# Patient Record
Sex: Female | Born: 1965
Health system: Southern US, Community
[De-identification: ages and names within clinical notes are randomized; demographics above are authoritative.]

## PROBLEM LIST (undated history)

## (undated) DIAGNOSIS — I10 Essential (primary) hypertension: Secondary | ICD-10-CM

## (undated) DIAGNOSIS — G35 Multiple sclerosis: Secondary | ICD-10-CM

## (undated) DIAGNOSIS — H539 Unspecified visual disturbance: Secondary | ICD-10-CM

## (undated) HISTORY — DX: Essential (primary) hypertension: I10

## (undated) HISTORY — DX: Unspecified visual disturbance: H53.9

## (undated) HISTORY — DX: Multiple sclerosis: G35

---

## 1992-03-20 HISTORY — PX: TUBAL LIGATION: SHX77

## 2004-01-20 ENCOUNTER — Ambulatory Visit: Payer: Self-pay | Admitting: Oncology

## 2004-03-23 ENCOUNTER — Ambulatory Visit: Payer: Self-pay | Admitting: Oncology

## 2004-05-11 ENCOUNTER — Ambulatory Visit: Payer: Self-pay | Admitting: Oncology

## 2004-09-02 ENCOUNTER — Ambulatory Visit: Payer: Self-pay | Admitting: Oncology

## 2011-04-19 DIAGNOSIS — G35 Multiple sclerosis: Secondary | ICD-10-CM | POA: Diagnosis not present

## 2011-04-26 DIAGNOSIS — G35 Multiple sclerosis: Secondary | ICD-10-CM | POA: Diagnosis not present

## 2011-05-01 DIAGNOSIS — G35 Multiple sclerosis: Secondary | ICD-10-CM | POA: Diagnosis not present

## 2011-05-11 DIAGNOSIS — K219 Gastro-esophageal reflux disease without esophagitis: Secondary | ICD-10-CM | POA: Diagnosis not present

## 2011-05-11 DIAGNOSIS — Z79899 Other long term (current) drug therapy: Secondary | ICD-10-CM | POA: Diagnosis not present

## 2011-05-11 DIAGNOSIS — D568 Other thalassemias: Secondary | ICD-10-CM | POA: Diagnosis not present

## 2011-05-11 DIAGNOSIS — E7800 Pure hypercholesterolemia, unspecified: Secondary | ICD-10-CM | POA: Diagnosis not present

## 2011-05-11 DIAGNOSIS — I10 Essential (primary) hypertension: Secondary | ICD-10-CM | POA: Diagnosis not present

## 2011-08-25 DIAGNOSIS — G35 Multiple sclerosis: Secondary | ICD-10-CM | POA: Diagnosis not present

## 2011-08-28 DIAGNOSIS — G35 Multiple sclerosis: Secondary | ICD-10-CM | POA: Diagnosis not present

## 2011-08-29 DIAGNOSIS — G35 Multiple sclerosis: Secondary | ICD-10-CM | POA: Diagnosis not present

## 2011-08-30 DIAGNOSIS — G35 Multiple sclerosis: Secondary | ICD-10-CM | POA: Diagnosis not present

## 2011-08-31 DIAGNOSIS — G35 Multiple sclerosis: Secondary | ICD-10-CM | POA: Diagnosis not present

## 2011-09-01 DIAGNOSIS — G35 Multiple sclerosis: Secondary | ICD-10-CM | POA: Diagnosis not present

## 2011-09-02 DIAGNOSIS — G35 Multiple sclerosis: Secondary | ICD-10-CM | POA: Diagnosis not present

## 2011-11-16 DIAGNOSIS — I10 Essential (primary) hypertension: Secondary | ICD-10-CM | POA: Diagnosis not present

## 2011-11-16 DIAGNOSIS — D568 Other thalassemias: Secondary | ICD-10-CM | POA: Diagnosis not present

## 2011-11-16 DIAGNOSIS — K219 Gastro-esophageal reflux disease without esophagitis: Secondary | ICD-10-CM | POA: Diagnosis not present

## 2011-11-16 DIAGNOSIS — E7800 Pure hypercholesterolemia, unspecified: Secondary | ICD-10-CM | POA: Diagnosis not present

## 2011-11-24 DIAGNOSIS — Z1231 Encounter for screening mammogram for malignant neoplasm of breast: Secondary | ICD-10-CM | POA: Diagnosis not present

## 2011-12-15 DIAGNOSIS — R05 Cough: Secondary | ICD-10-CM | POA: Diagnosis not present

## 2011-12-15 DIAGNOSIS — K219 Gastro-esophageal reflux disease without esophagitis: Secondary | ICD-10-CM | POA: Diagnosis not present

## 2012-03-14 DIAGNOSIS — E7800 Pure hypercholesterolemia, unspecified: Secondary | ICD-10-CM | POA: Diagnosis not present

## 2012-03-14 DIAGNOSIS — K219 Gastro-esophageal reflux disease without esophagitis: Secondary | ICD-10-CM | POA: Diagnosis not present

## 2012-03-14 DIAGNOSIS — I10 Essential (primary) hypertension: Secondary | ICD-10-CM | POA: Diagnosis not present

## 2012-03-14 DIAGNOSIS — D568 Other thalassemias: Secondary | ICD-10-CM | POA: Diagnosis not present

## 2012-04-30 DIAGNOSIS — G35 Multiple sclerosis: Secondary | ICD-10-CM | POA: Diagnosis not present

## 2012-09-12 DIAGNOSIS — Z6830 Body mass index (BMI) 30.0-30.9, adult: Secondary | ICD-10-CM | POA: Diagnosis not present

## 2012-09-12 DIAGNOSIS — E7800 Pure hypercholesterolemia, unspecified: Secondary | ICD-10-CM | POA: Diagnosis not present

## 2012-09-12 DIAGNOSIS — Z0000 Encounter for general adult medical examination without abnormal findings: Secondary | ICD-10-CM | POA: Diagnosis not present

## 2012-09-12 DIAGNOSIS — D568 Other thalassemias: Secondary | ICD-10-CM | POA: Diagnosis not present

## 2012-09-12 DIAGNOSIS — I10 Essential (primary) hypertension: Secondary | ICD-10-CM | POA: Diagnosis not present

## 2012-09-12 DIAGNOSIS — Z124 Encounter for screening for malignant neoplasm of cervix: Secondary | ICD-10-CM | POA: Diagnosis not present

## 2012-11-26 DIAGNOSIS — Z1231 Encounter for screening mammogram for malignant neoplasm of breast: Secondary | ICD-10-CM | POA: Diagnosis not present

## 2013-03-18 DIAGNOSIS — D568 Other thalassemias: Secondary | ICD-10-CM | POA: Diagnosis not present

## 2013-03-18 DIAGNOSIS — I10 Essential (primary) hypertension: Secondary | ICD-10-CM | POA: Diagnosis not present

## 2013-03-18 DIAGNOSIS — E7800 Pure hypercholesterolemia, unspecified: Secondary | ICD-10-CM | POA: Diagnosis not present

## 2013-04-11 DIAGNOSIS — Z23 Encounter for immunization: Secondary | ICD-10-CM | POA: Diagnosis not present

## 2013-04-22 DIAGNOSIS — G9389 Other specified disorders of brain: Secondary | ICD-10-CM | POA: Diagnosis not present

## 2013-04-22 DIAGNOSIS — M519 Unspecified thoracic, thoracolumbar and lumbosacral intervertebral disc disorder: Secondary | ICD-10-CM | POA: Diagnosis not present

## 2013-04-22 DIAGNOSIS — G35 Multiple sclerosis: Secondary | ICD-10-CM | POA: Diagnosis not present

## 2013-04-22 DIAGNOSIS — M5090 Cervical disc disorder, unspecified, unspecified cervical region: Secondary | ICD-10-CM | POA: Diagnosis not present

## 2013-04-29 DIAGNOSIS — G35 Multiple sclerosis: Secondary | ICD-10-CM | POA: Diagnosis not present

## 2013-09-30 DIAGNOSIS — Z0000 Encounter for general adult medical examination without abnormal findings: Secondary | ICD-10-CM | POA: Diagnosis not present

## 2013-09-30 DIAGNOSIS — E559 Vitamin D deficiency, unspecified: Secondary | ICD-10-CM | POA: Diagnosis not present

## 2013-09-30 DIAGNOSIS — D568 Other thalassemias: Secondary | ICD-10-CM | POA: Diagnosis not present

## 2013-09-30 DIAGNOSIS — E7800 Pure hypercholesterolemia, unspecified: Secondary | ICD-10-CM | POA: Diagnosis not present

## 2013-09-30 DIAGNOSIS — G35 Multiple sclerosis: Secondary | ICD-10-CM | POA: Diagnosis not present

## 2013-09-30 DIAGNOSIS — I10 Essential (primary) hypertension: Secondary | ICD-10-CM | POA: Diagnosis not present

## 2013-09-30 DIAGNOSIS — G609 Hereditary and idiopathic neuropathy, unspecified: Secondary | ICD-10-CM | POA: Diagnosis not present

## 2013-09-30 DIAGNOSIS — Z111 Encounter for screening for respiratory tuberculosis: Secondary | ICD-10-CM | POA: Diagnosis not present

## 2013-11-27 DIAGNOSIS — Z1231 Encounter for screening mammogram for malignant neoplasm of breast: Secondary | ICD-10-CM | POA: Diagnosis not present

## 2013-12-04 DIAGNOSIS — R928 Other abnormal and inconclusive findings on diagnostic imaging of breast: Secondary | ICD-10-CM | POA: Diagnosis not present

## 2014-04-02 DIAGNOSIS — G35 Multiple sclerosis: Secondary | ICD-10-CM | POA: Diagnosis not present

## 2014-04-02 DIAGNOSIS — G9589 Other specified diseases of spinal cord: Secondary | ICD-10-CM | POA: Diagnosis not present

## 2014-04-03 DIAGNOSIS — D569 Thalassemia, unspecified: Secondary | ICD-10-CM | POA: Diagnosis not present

## 2014-04-03 DIAGNOSIS — G35 Multiple sclerosis: Secondary | ICD-10-CM | POA: Diagnosis not present

## 2014-04-03 DIAGNOSIS — E780 Pure hypercholesterolemia: Secondary | ICD-10-CM | POA: Diagnosis not present

## 2014-04-03 DIAGNOSIS — E559 Vitamin D deficiency, unspecified: Secondary | ICD-10-CM | POA: Diagnosis not present

## 2014-04-03 DIAGNOSIS — I10 Essential (primary) hypertension: Secondary | ICD-10-CM | POA: Diagnosis not present

## 2014-05-05 DIAGNOSIS — G35 Multiple sclerosis: Secondary | ICD-10-CM | POA: Diagnosis not present

## 2014-05-21 ENCOUNTER — Encounter: Payer: Self-pay | Admitting: Neurology

## 2014-05-21 ENCOUNTER — Ambulatory Visit (INDEPENDENT_AMBULATORY_CARE_PROVIDER_SITE_OTHER): Payer: Medicare Other | Admitting: Neurology

## 2014-05-21 VITALS — BP 123/70 | HR 70 | Resp 16 | Ht 70.0 in | Wt 221.0 lb

## 2014-05-21 DIAGNOSIS — G373 Acute transverse myelitis in demyelinating disease of central nervous system: Secondary | ICD-10-CM

## 2014-05-21 DIAGNOSIS — Z79899 Other long term (current) drug therapy: Secondary | ICD-10-CM | POA: Insufficient documentation

## 2014-05-21 DIAGNOSIS — G0489 Other myelitis: Secondary | ICD-10-CM | POA: Diagnosis not present

## 2014-05-21 DIAGNOSIS — G35 Multiple sclerosis: Secondary | ICD-10-CM | POA: Diagnosis not present

## 2014-05-21 NOTE — Progress Notes (Addendum)
[facility]  PATIENT: Margaret Henderson DOB: 05-24-1965  REFERRING DOCTOR OR PCP:  Dr. Cyndi Bender [facility]), Neuro:  Antonieta Loveless SOURCE: paitent and records form [facility]  _________________________________   HISTORICAL  CHIEF COMPLAINT:  Chief Complaint  Patient presents with  . Multiple Sclerosis    Sts. dx. in 2002.  Presenting sx. were numbness from the waist down and decreased grip bilat.  Sts. she was put on Betaseron, which she remains on.  Sts. Dr. Metta Clines told her she had a new, "inactive" lesion "on my spine", on mri done in Feb. 2016 at [place]. she doesn't have any sx. at all currently./fim    HISTORY OF PRESENT ILLNESS:  I had the pleasure of seeing your patient, Margaret Henderson, at [facility] for a neurologic consultation regarding her multiple sclerosis. As you know, she is a 49 year old woman who was diagnosed with multiple sclerosis in 2002 after presenting with numbness below her waist and clumsiness in the arms and legs. At that time, she was found to have an MRI consistent with multiple sclerosis. She also underwent a lumbar puncture. That was also consistent with multiple sclerosis. She was started on Betaseron and has continued on Betaseron. Last year, an MRI of the brain showed 2 new foci not present in her earlier MRI. About 6 weeks ago she had another series of MRI. The brain and cervical spine were unchanged but her thoracic spine showed a new focus. Of note, she has not noted any difference in symptoms.  She is doing very well with her MS and does not note any major symptoms.  Gait/strength/sensation: She does not note any difficulty with her gait. She has no difficulty with balance. She has no difficulty climbing stairs. She denies any numbness or weakness in the arms or legs. There is no problems with coordination. She did note a little bit of allodynia in her feet when she would put her  shoes on. However, that has resolved. This went on for a couple months but resolved last month.  Bladder: She denies any difficulty with bladder or bowel function.  Vision: She denies any history of optic neuritis or diplopia. She has no current visual problems.  Fatigue/sleep: She denies any difficulty with depression or anxiety. She has not noted any significant cognitive dysfunction. She notes no major problems with memory or processing speed or word finding.  REVIEW OF SYSTEMS: Constitutional: No fevers, chills, sweats, or change in appetite Eyes: No visual changes, double vision, eye pain Ear, nose and throat: No hearing loss, ear pain, nasal congestion, sore throat Cardiovascular: No chest pain, palpitations Respiratory: No shortness of breath at rest or with exertion.   No wheezes GastrointestinaI: No nausea, vomiting, diarrhea, abdominal pain, fecal incontinence Genitourinary: No dysuria, urinary retention or frequency.  No nocturia. Musculoskeletal: No neck pain, back pain Integumentary: No rash, pruritus, skin lesions Neurological: as above Psychiatric: No depression at this time.  No anxiety Endocrine: No palpitations, diaphoresis, change in appetite, change in weigh or increased thirst Hematologic/Lymphatic: No anemia, purpura, petechiae. Allergic/Immunologic: No itchy/runny eyes, nasal congestion, recent allergic reactions, rashes  ALLERGIES: No Known Allergies  HOME MEDICATIONS:  Current outpatient prescriptions:  .  amLODipine (NORVASC) 5 MG tablet, Take 5 mg by mouth daily., Disp: , Rfl: 4 .  atorvastatin (LIPITOR) 40 MG tablet, Take 40 mg by mouth daily., Disp: , Rfl: 5 .  BETASERON 0.3 MG KIT injection, , Disp: , Rfl:   PAST MEDICAL HISTORY:  Past Medical History  Diagnosis Date  . Hypertension   . Multiple sclerosis   . Vision abnormalities     PAST SURGICAL HISTORY: Past Surgical History  Procedure Laterality Date  . Tubal ligation  1994     FAMILY HISTORY: Family History  Problem Relation Age of Onset  . Anemia Mother   . Diabetes Mother   . Hypertension Mother     SOCIAL HISTORY:  History   Social History  . Marital Status: Single    Spouse Name: N/A  . Number of Children: 2  . Years of Education: N/A   Occupational History  . Medical records    Social History Main Topics  . Smoking status: Never Smoker   . Smokeless tobacco: Not on file  . Alcohol Use: No  . Drug Use: No  . Sexual Activity: Not on file   Other Topics Concern  . Not on file   Social History Narrative     PHYSICAL EXAM  Filed Vitals:   05/21/14 1411  BP: 123/70  Pulse: 70  Resp: 16  Height: $Remove'5\' 10"'VahPXyz$  (1.778 m)  Weight: 221 lb (100.245 kg)    Body mass index is 31.71 kg/(m^2).   General: The patient is well-developed and well-nourished and in no acute distress  Eyes:  Funduscopic exam shows normal optic discs and retinal vessels.  Neck: The neck is supple, no carotid bruits are noted.  The neck is nontender.  Cardiovascular: The heart has a regular rate and rhythm with a normal S1 and S2. There were no murmurs, gallops or rubs. Lungs are clear to auscultation.  Skin: Extremities are without significant edema.  Musculoskeletal:  Back is nontender  Neurologic Exam  Mental status: The patient is alert and oriented x 3 at the time of the examination. The patient has apparent normal recent and remote memory, with an apparently normal attention span and concentration ability.   Speech is normal.  Cranial nerves: Extraocular movements are full. Pupils are equal, round, and reactive to light and accomodation.  Visual fields are full.  Facial symmetry is present. There is good facial sensation to soft touch bilaterally.Facial strength is normal.  Trapezius and sternocleidomastoid strength is normal. No dysarthria is noted.  The tongue is midline, and the patient has symmetric elevation of the soft palate. No obvious hearing  deficits are noted.  Motor:  Muscle bulk is normal.   Tone is normal. Strength is  5 / 5 in all 4 extremities.   Sensory: Sensory testing is intact to pinprick, soft touch and vibration sensation in all 4 extremities.  Coordination: Cerebellar testing reveals good finger-nose-finger and heel-to-shin bilaterally.  Gait and station: Station is normal.   Gait is normal. Tandem gait is normal. Romberg is negative.   Reflexes: Deep tendon reflexes are symmetric and normal bilaterally.   Plantar responses are flexor.    DIAGNOSTIC DATA (LABS, IMAGING, TESTING) - I reviewed patient records, labs, notes, testing and imaging myself where available.  I personally reviewed MRIs of the brain, cervical spine and thoracic spine from 04/02/2014. In the brain, there are at least one doesn't hyperintense foci, predominantly in the periventricular white matter, radially oriented to the ventricle. Soma also in the juxtacortical white matter. In the cervical spine, there is a focus adjacent to C3 posteriorly and in the thoracic spine, there is a focus adjacent to T8-T9, posteriorly. The thoracic spine was not present on previous MRI films, the brain lesions and the cervical spine lesions were present  on her prior MRI.     ASSESSMENT AND PLAN  Multiple sclerosis - Plan: CBC with Differential/Platelet  Transverse myelitis  High risk medication use   In summary, Margaret Henderson is a 49 year old woman with multiple sclerosis for many years who has been clinically stable on Betaseron for many years but shows progression on MRI with 2 new brain lesions in 2015 and a new thoracic spine lesion last month. It is possible that the minimal dysesthetic sensation she had in her feet over the past several months were related to the thoracic spine focus. Regardless, she is now back to her excellent baseline. I had a detailed discussion about her multiple sclerosis concern about breakthrough disease noted on MRI.    Breakthrough lesions on MRI imaging while on interferon therapy have been shown in a couple of studies to be related to more disability later in life. Therefore, I feel we do need to switch to a possibly more efficacious medicine. I went over the oral medication options and Tysabri.  She has a good acquaintance who is on Tecfidera and she expressed the most interested in that medication. Therefore, I will go ahead and signed her up for Tecfidera we discussed the importance of taking each pill after breakfast and after dinner to minimize flushing and GI related side effects. She understands that there has been ASIS of PML on Tecfidera but that the risk is generally fairly low. She was less interested in Tysabri due to the PML risk.  She will return to see me in a couple months so that we can ensure that she is doing well on the Tecfidera and I will recheck count at that visit that she is not experiencing lymphopenia. After that I would be more than happy to see her or she could follow up with you, depending on your and her preference. She is advised to call us sooner if she has any new or worsening neurologic symptoms.  Thank you for asking me to see Mrs. Harris. Please do not hesitate to let me know if I can be of any further assistance with her or other patients in the future.     Alyscia Carmon A. Felecia Shelling, MD, PhD 10/19/6413, 8:30 PM Certified in Neurology, Clinical Neurophysiology, Sleep Medicine, Pain Medicine and Neuroimaging  [facility] [address] [place], [place] [address] [phone]

## 2014-05-22 ENCOUNTER — Telehealth: Payer: Self-pay | Admitting: *Deleted

## 2014-05-22 LAB — CBC WITH DIFFERENTIAL/PLATELET
BASOS: 0 %
Basophils Absolute: 0 10*3/uL (ref 0.0–0.2)
EOS: 1 %
Eosinophils Absolute: 0.1 10*3/uL (ref 0.0–0.4)
HCT: 36.8 % (ref 34.0–46.6)
HEMOGLOBIN: 12.1 g/dL (ref 11.1–15.9)
Immature Grans (Abs): 0 10*3/uL (ref 0.0–0.1)
Immature Granulocytes: 0 %
LYMPHS ABS: 3.3 10*3/uL — AB (ref 0.7–3.1)
Lymphs: 42 %
MCH: 24.1 pg — ABNORMAL LOW (ref 26.6–33.0)
MCHC: 32.9 g/dL (ref 31.5–35.7)
MCV: 73 fL — AB (ref 79–97)
MONOCYTES: 9 %
MONOS ABS: 0.8 10*3/uL (ref 0.1–0.9)
NEUTROS ABS: 3.7 10*3/uL (ref 1.4–7.0)
Neutrophils Relative %: 48 %
Platelets: 362 10*3/uL (ref 150–379)
RBC: 5.02 x10E6/uL (ref 3.77–5.28)
RDW: 16.2 % — ABNORMAL HIGH (ref 12.3–15.4)
WBC: 7.9 10*3/uL (ref 3.4–10.8)

## 2014-05-22 NOTE — Telephone Encounter (Signed)
-----   Message from Richard A. Sater, MD sent at 05/22/2014 12:51 PM EST ----- Labs ok can send in Tecfidera 

## 2014-05-22 NOTE — Telephone Encounter (Signed)
No phone # listed, will need to send unable to contact letter/fim

## 2014-05-25 ENCOUNTER — Encounter: Payer: Self-pay | Admitting: *Deleted

## 2014-05-25 NOTE — Telephone Encounter (Signed)
No phone # to contact pt. at.  Tecfidera start form faxed to [employer] at fax # [phone]/fim

## 2014-05-25 NOTE — Telephone Encounter (Signed)
-----   Message from Richard A. Epimenio Foot, MD sent at 05/22/2014 12:51 PM EST ----- Labs ok can send in Tecfidera

## 2014-05-29 ENCOUNTER — Telehealth: Payer: Self-pay | Admitting: Neurology

## 2014-05-29 NOTE — Telephone Encounter (Signed)
[facility] is calling             We rcd Tecfidera

## 2014-06-08 ENCOUNTER — Encounter: Payer: Self-pay | Admitting: *Deleted

## 2014-06-10 NOTE — Telephone Encounter (Signed)
The original message was never forwarded to anyone.  I just received message today.  Ins has been contacted and provided with clinical info.  Request is under review.  Starter Kit (Ref Key: [id number]) Maint Dose (Ref Key: [id number])

## 2014-06-10 NOTE — Telephone Encounter (Signed)
Margaret Henderson with [employer] @ [phone] ext [phone], stated PA is needed for Tecifedra.  Also stated patient has MCD, but never received card..Please call and advise.

## 2014-06-15 ENCOUNTER — Telehealth: Payer: Self-pay | Admitting: Neurology

## 2014-06-15 NOTE — Telephone Encounter (Signed)
Pt is calling stating she is waiting to get prior authorization for Tecfiedera, she states it's been a month.  Please call and advise.

## 2014-06-15 NOTE — Telephone Encounter (Signed)
The message regarding PA was sent last week.  We contacted ins, and have not yet gotten a response.  I called Medicaid to follow up.  Spoke with Rosanne Ashing.  He said the patient has a Medicare Drug Plan (which we do not have on file), so they are unable to review request.  Says the ins has to bill Medicare instead of Medicaid.  I contacted Medicare.  Patient has Medical sales representative Rx [employer].  Patient ID # [id number].  Request has been approved valid until 06/15/2015 Ref # [id number].  I called the pharmacy.  Spoke with Sharmaine who transferred me to [facility].  They have noted account and are processing the request.  I called the patient back.  Got no answer.  Left message.

## 2014-06-15 NOTE — Telephone Encounter (Signed)
Vicky with [facility] @ [phone] x [phone], checking status of PA faxed on 3/25 for Rx TECFIDERA.  Please call and advise.

## 2014-08-27 ENCOUNTER — Ambulatory Visit (INDEPENDENT_AMBULATORY_CARE_PROVIDER_SITE_OTHER): Payer: Medicare Other | Admitting: Neurology

## 2014-08-27 ENCOUNTER — Encounter: Payer: Self-pay | Admitting: Neurology

## 2014-08-27 VITALS — BP 134/84 | HR 70 | Resp 16 | Ht 70.0 in | Wt 222.4 lb

## 2014-08-27 DIAGNOSIS — G35 Multiple sclerosis: Secondary | ICD-10-CM | POA: Diagnosis not present

## 2014-08-27 DIAGNOSIS — Z79899 Other long term (current) drug therapy: Secondary | ICD-10-CM

## 2014-08-27 DIAGNOSIS — G0489 Other myelitis: Secondary | ICD-10-CM

## 2014-08-27 DIAGNOSIS — G373 Acute transverse myelitis in demyelinating disease of central nervous system: Secondary | ICD-10-CM

## 2014-08-27 NOTE — Progress Notes (Signed)
[facility]  PATIENT: Margaret Henderson DOB: [birth year]  REFERRING DOCTOR OR PCP:  Dr. Lonie Peak [facility]), Neuro:  Lincoln Brigham SOURCE: paitent and records form [facility]  _________________________________   HISTORICAL  CHIEF COMPLAINT:  Chief Complaint  Patient presents with  . Multiple Sclerosis    Sts. she tolerates Tecfidera well.  Flushing has completely resolved.  She denies new or worsening sx/fim    HISTORY OF PRESENT ILLNESS:  Margaret Henderson is a 49 yo woman with RRMS. She was diagnosed with multiple sclerosis in 2002 after presenting with numbness below her waist and clumsiness in the arms and legs. At that time, she was found to have an MRI consistent with multiple sclerosis. She also underwent a lumbar puncture and CSF was  consistent with multiple sclerosis. She was started on Betaseron and has continued on Betaseron. Last year, an MRI of the brain showed 2 new foci not present in her earlier MRI. About 6 weeks ago she had another series of MRI. The brain and cervical spine were unchanged but her thoracic spine showed a new focus. Of note, she has not noted any difference in symptoms.   [facility]  She started Tecfidera several months ago.  She never had any GI symptoms. She did note some flushing but that has greatly improved after the first month.  Gait/strength/sensation: Gait is doing well and she has no difficulty with balance. She has no difficulty climbing stairs. She denies any numbness or weakness in the arms or legs. There is no problems with coordination.   Last year, she did have an episode of mild dysesthesias in her feet that resolved after about 2 weeks.  Bladder: She denies any difficulty with bladder or bowel function.  Vision: She denies any history of optic neuritis or diplopia. She has no current visual problems.  Mood/cognition/Fatigue/sleep: She denies any difficulty with depression or anxiety. She has not  noted any significant cognitive dysfunction. She notes no major problems with memory or processing speed or word finding.   She generally does not note any significant fatigue. If temperatures are very hot she will feel a little more tired. She feels that she sleeps well at night.  REVIEW OF SYSTEMS: Constitutional: No fevers, chills, sweats, or change in appetite Eyes: No visual changes, double vision, eye pain Ear, nose and throat: No hearing loss, ear pain, nasal congestion, sore throat Cardiovascular: No chest pain, palpitations Respiratory: No shortness of breath at rest or with exertion.   No wheezes GastrointestinaI: No nausea, vomiting, diarrhea, abdominal pain, fecal incontinence Genitourinary: No dysuria, urinary retention or frequency.  No nocturia. Musculoskeletal: No neck pain, back pain Integumentary: No rash, pruritus, skin lesions Neurological: as above Psychiatric: No depression at this time.  No anxiety Endocrine: No palpitations, diaphoresis, change in appetite, change in weigh or increased thirst Hematologic/Lymphatic: No anemia, purpura, petechiae. Allergic/Immunologic: No itchy/runny eyes, nasal congestion, recent allergic reactions, rashes  ALLERGIES: No Known Allergies  HOME MEDICATIONS:  Current outpatient prescriptions:  .  amLODipine (NORVASC) 5 MG tablet, Take 5 mg by mouth daily., Disp: , Rfl: 4 .  atorvastatin (LIPITOR) 40 MG tablet, Take 40 mg by mouth daily., Disp: , Rfl: 5 .  cholecalciferol (VITAMIN D) 1000 UNITS tablet, Take 2,000 Units by mouth 2 (two) times daily., Disp: , Rfl:  .  Dimethyl Fumarate 120 & 240 MG MISC, Take by mouth., Disp: , Rfl:   PAST MEDICAL HISTORY: Past Medical History  Diagnosis Date  .  Hypertension   . Multiple sclerosis   . Vision abnormalities     PAST SURGICAL HISTORY: Past Surgical History  Procedure Laterality Date  . Tubal ligation  1994    FAMILY HISTORY: Family History  Problem Relation Age of Onset    . Anemia Mother   . Diabetes Mother   . Hypertension Mother     SOCIAL HISTORY:  History   Social History  . Marital Status: Single    Spouse Name: N/A  . Number of Children: 2  . Years of Education: N/A   Occupational History  . Medical records    Social History Main Topics  . Smoking status: Never Smoker   . Smokeless tobacco: Not on file  . Alcohol Use: No  . Drug Use: No  . Sexual Activity: Not on file   Other Topics Concern  . Not on file   Social History Narrative     PHYSICAL EXAM  Filed Vitals:   08/27/14 1147  BP: 134/84  Pulse: 70  Resp: 16  Height:  (1.778 m)  Weight: 222 lb 6.4 oz (100.88 kg)    Body mass index is 31.91 kg/(m^2).   General: The patient is well-developed and well-nourished and in no acute distress   Neurologic Exam  Mental status: The patient is alert and oriented x 3 at the time of the examination. The patient has apparent normal recent and remote memory, with an apparently normal attention span and concentration ability.   Speech is normal.  Cranial nerves: Extraocular movements are full. Facial symmetry is present. There is good facial sensation to soft touch bilaterally.Facial strength is normal.  Trapezius and sternocleidomastoid strength is normal. No dysarthria is noted.  The tongue is midline, and the patient has symmetric elevation of the soft palate. No obvious hearing deficits are noted.  Motor:  Muscle bulk is normal.   Tone is normal. Strength is  5 / 5 in all 4 extremities.   Sensory: Sensory testing is intact to pinprick, soft touch and vibration sensation in all 4 extremities.  Coordination: Cerebellar testing reveals good finger-nose-finger and heel-to-shin bilaterally.  Gait and station: Station is normal.   Gait is normal. Tandem gait is mildly wide and Romberg is borderline.   Reflexes: Deep tendon reflexes are symmetric and normal bilaterally.      DIAGNOSTIC DATA (LABS, IMAGING, TESTING) - I  reviewed patient records, labs, notes, testing and imaging myself where available.  I personally reviewed MRIs of the brain, cervical spine and thoracic spine from 04/02/2014. In the brain, there are at least one doesn't hyperintense foci, predominantly in the periventricular white matter, radially oriented to the ventricle. Soma also in the juxtacortical white matter. In the cervical spine, there is a focus adjacent to C3 posteriorly and in the thoracic spine, there is a focus adjacent to T8-T9, posteriorly. The thoracic spine was not present on previous MRI films, the brain lesions and the cervical spine lesions were present on her prior MRI.     ASSESSMENT AND PLAN  Multiple sclerosis  Transverse myelitis  High risk medication use  1.  She will continue Tecfidera. I will check her CBC and differential to make sure that she does not have lymphopenia. We discussed that is a common side effect but if the levels go down too low, we would need to consider switching her off the medication. 2.  Continue vitamin D supplements at 4000 units daily. 3.  She will remain active and exercises tolerated. 4.  Return to clinic 6 months or sooner if there are new or worsening neurologic symptoms.   Richard A. Epimenio Foot, MD, PhD 08/27/2014, 12:12 PM Certified in Neurology, Clinical Neurophysiology, Sleep Medicine, Pain Medicine and Neuroimaging  [facility] [address] [place], [place] [address] [phone]

## 2014-08-28 ENCOUNTER — Encounter: Payer: Self-pay | Admitting: *Deleted

## 2014-08-28 ENCOUNTER — Telehealth: Payer: Self-pay | Admitting: *Deleted

## 2014-08-28 LAB — CBC WITH DIFFERENTIAL/PLATELET
BASOS ABS: 0 10*3/uL (ref 0.0–0.2)
BASOS: 0 %
EOS (ABSOLUTE): 0.2 10*3/uL (ref 0.0–0.4)
EOS: 4 %
Hematocrit: 36 % (ref 34.0–46.6)
Hemoglobin: 11.8 g/dL (ref 11.1–15.9)
Immature Grans (Abs): 0 10*3/uL (ref 0.0–0.1)
Immature Granulocytes: 0 %
LYMPHS ABS: 1.1 10*3/uL (ref 0.7–3.1)
LYMPHS: 28 %
MCH: 23.8 pg — AB (ref 26.6–33.0)
MCHC: 32.8 g/dL (ref 31.5–35.7)
MCV: 73 fL — ABNORMAL LOW (ref 79–97)
MONOCYTES: 15 %
Monocytes Absolute: 0.6 10*3/uL (ref 0.1–0.9)
NEUTROS ABS: 2.2 10*3/uL (ref 1.4–7.0)
Neutrophils: 53 %
PLATELETS: 314 10*3/uL (ref 150–379)
RBC: 4.95 x10E6/uL (ref 3.77–5.28)
RDW: 16.5 % — AB (ref 12.3–15.4)
WBC: 4.1 10*3/uL (ref 3.4–10.8)

## 2014-08-28 NOTE — Telephone Encounter (Signed)
Letter informing Margaret Henderson of lab results as below was mailed to her today. (she has no phone #).  See letter for details/fim

## 2014-08-28 NOTE — Telephone Encounter (Signed)
-----   Message from Asa Lente, MD sent at 08/28/2014  8:27 AM EDT ----- Plead let her know the lymphocytes look ok.   The RBC's are a little small, she should take OTC iron supplement

## 2014-09-08 ENCOUNTER — Telehealth: Payer: Self-pay | Admitting: Neurology

## 2014-09-08 DIAGNOSIS — D569 Thalassemia, unspecified: Secondary | ICD-10-CM | POA: Insufficient documentation

## 2014-09-08 NOTE — Telephone Encounter (Signed)
I have spoken with Margaret Henderson this morning who sts. she has a known hx. of thalassemia--has seen a  hematologist in the past and is already taking a daily Fe supplement (see recent labwork, result note for more details.  I have added thalassemia to her problem list/fim

## 2014-09-08 NOTE — Telephone Encounter (Signed)
Patient called and requested to speak with Faith RN regarding a letter she sent to the patient. Please call and advise.

## 2014-10-08 DIAGNOSIS — Z1239 Encounter for other screening for malignant neoplasm of breast: Secondary | ICD-10-CM | POA: Diagnosis not present

## 2014-10-08 DIAGNOSIS — E780 Pure hypercholesterolemia: Secondary | ICD-10-CM | POA: Diagnosis not present

## 2014-10-08 DIAGNOSIS — I10 Essential (primary) hypertension: Secondary | ICD-10-CM | POA: Diagnosis not present

## 2014-10-08 DIAGNOSIS — Z0000 Encounter for general adult medical examination without abnormal findings: Secondary | ICD-10-CM | POA: Diagnosis not present

## 2014-10-08 DIAGNOSIS — D569 Thalassemia, unspecified: Secondary | ICD-10-CM | POA: Diagnosis not present

## 2014-10-08 DIAGNOSIS — G35 Multiple sclerosis: Secondary | ICD-10-CM | POA: Diagnosis not present

## 2014-10-08 DIAGNOSIS — Z6832 Body mass index (BMI) 32.0-32.9, adult: Secondary | ICD-10-CM | POA: Diagnosis not present

## 2014-10-08 DIAGNOSIS — E559 Vitamin D deficiency, unspecified: Secondary | ICD-10-CM | POA: Diagnosis not present

## 2014-10-08 DIAGNOSIS — Z1389 Encounter for screening for other disorder: Secondary | ICD-10-CM | POA: Diagnosis not present

## 2014-12-17 DIAGNOSIS — Z1231 Encounter for screening mammogram for malignant neoplasm of breast: Secondary | ICD-10-CM | POA: Diagnosis not present

## 2014-12-17 DIAGNOSIS — R928 Other abnormal and inconclusive findings on diagnostic imaging of breast: Secondary | ICD-10-CM | POA: Diagnosis not present

## 2014-12-31 DIAGNOSIS — N6081 Other benign mammary dysplasias of right breast: Secondary | ICD-10-CM | POA: Diagnosis not present

## 2014-12-31 DIAGNOSIS — R928 Other abnormal and inconclusive findings on diagnostic imaging of breast: Secondary | ICD-10-CM | POA: Diagnosis not present

## 2014-12-31 DIAGNOSIS — N6489 Other specified disorders of breast: Secondary | ICD-10-CM | POA: Diagnosis not present

## 2015-02-25 ENCOUNTER — Ambulatory Visit (INDEPENDENT_AMBULATORY_CARE_PROVIDER_SITE_OTHER): Payer: Medicare Other | Admitting: Neurology

## 2015-02-25 ENCOUNTER — Encounter: Payer: Self-pay | Admitting: Neurology

## 2015-02-25 VITALS — BP 126/74 | HR 68 | Resp 14 | Ht 70.0 in | Wt 220.4 lb

## 2015-02-25 DIAGNOSIS — G35 Multiple sclerosis: Secondary | ICD-10-CM

## 2015-02-25 DIAGNOSIS — G373 Acute transverse myelitis in demyelinating disease of central nervous system: Secondary | ICD-10-CM | POA: Diagnosis not present

## 2015-02-25 DIAGNOSIS — Z79899 Other long term (current) drug therapy: Secondary | ICD-10-CM | POA: Diagnosis not present

## 2015-02-25 NOTE — Progress Notes (Signed)
[facility]  PATIENT: Margaret Henderson DOB: June 01, 1965  REFERRING DOCTOR OR PCP:  Dr. Lonie Peak [facility] Neuro:  Lincoln Brigham SOURCE: paitent and [facility] form [facility]  _________________________________   HISTORICAL  CHIEF COMPLAINT:  Chief Complaint  Patient presents with  . Multiple Sclerosis    Sts. she continues to tolerate Tecfidera well.  Deneis new or worsening sx./fim    HISTORY OF PRESENT ILLNESS:  Margaret Henderson is a 49 yo woman with RRMS.   She started Tecfidera earlier this year and is tolerating it well.    She never had any GI symptoms. At first she had flushing but that is doing better.    She denies any new exacerbation.   She takes Vit D supplements daily.    Gait/strength/sensation: Gait is doing well and she has no difficulty with balance. She has no difficulty climbing stairs. She denies any numbness or weakness in the arms or legs. There is no problems with coordination.     Bladder: She denies any difficulty with bladder or bowel function.   She denies nocturia.   No constipation.    Vision: She denies any history of optic neuritis or diplopia. She has no current visual problems.  Mood/cognition:  She denies any difficulty with depression or anxiety. She has not noted any significant cognitive dysfunction. She notes no major problems with memory or processing speed or word finding.    Fatigue/sleep:   She has occasional days with fatigue, usually later in the day at work if she has longer hours.   A little rest helps her feel better.  She usually does not have heat intolerance.   She feels that she sleeps well at night.  MS History:  She was diagnosed with multiple sclerosis in 2002 after presenting with numbness below her waist and clumsiness in the arms and legs. At that time, she was found to have an MRI consistent with multiple sclerosis. She also underwent a lumbar puncture and CSF was  consistent with multiple sclerosis. She  was started on Betaseron and has continued on Betaseron. Last year, an MRI of the brain showed 2 new foci not present in her earlier MRI. She had MRI's mid 2016 at [place]. . The brain and cervical spine were unchanged but her thoracic spine showed a new focus compared to 2015.   Other:   She has thalassemia and needs to take iron.    Last HgB was 11.8.     REVIEW OF SYSTEMS: Constitutional: No fevers, chills, sweats, or change in appetite.   Rare fatigue Eyes: No visual changes, double vision, eye pain Ear, nose and throat: No hearing loss, ear pain, nasal congestion, sore throat Cardiovascular: No chest pain, palpitations Respiratory: No shortness of breath at rest or with exertion.   No wheezes GastrointestinaI: No nausea, vomiting, diarrhea, abdominal pain, fecal incontinence Genitourinary: No dysuria, urinary retention or frequency.  No nocturia. Musculoskeletal: No neck pain, back pain Integumentary: No rash, pruritus, skin lesions Neurological: as above Psychiatric: No depression at this time.  No anxiety Endocrine: No palpitations, diaphoresis, change in appetite, change in weigh or increased thirst Hematologic/Lymphatic: No anemia, purpura, petechiae. Allergic/Immunologic: No itchy/runny eyes, nasal congestion, recent allergic reactions, rashes  ALLERGIES: No Known Allergies  HOME MEDICATIONS:  Current outpatient prescriptions:  .  amLODipine (NORVASC) 5 MG tablet, Take 5 mg by mouth daily., Disp: , Rfl: 4 .  atorvastatin (LIPITOR) 40 MG tablet, Take 40 mg by mouth daily., Disp: , Rfl: 5 .  cholecalciferol (VITAMIN D) 1000 UNITS tablet, Take 2,000 Units by mouth 2 (two) times daily., Disp: , Rfl:  .  Dimethyl Fumarate 120 & 240 MG MISC, Take by mouth., Disp: , Rfl:   PAST MEDICAL HISTORY: Past Medical History  Diagnosis Date  . Hypertension   . Multiple sclerosis (HCC)   . Vision abnormalities     PAST SURGICAL HISTORY: Past Surgical History  Procedure  Laterality Date  . Tubal ligation  1994    FAMILY HISTORY: Family History  Problem Relation Age of Onset  . Anemia Mother   . Diabetes Mother   . Hypertension Mother     SOCIAL HISTORY:  Social History   Social History  . Marital Status: Single    Spouse Name: N/A  . Number of Children: 2  . Years of Education: N/A   Occupational History  . Medical records    Social History Main Topics  . Smoking status: Never Smoker   . Smokeless tobacco: Not on file  . Alcohol Use: No  . Drug Use: No  . Sexual Activity: Not on file   Other Topics Concern  . Not on file   Social History Narrative     PHYSICAL EXAM  Filed Vitals:   02/25/15 0931  BP: 126/74  Pulse: 68  Resp: 14  Height:  (1.778 m)  Weight: 220 lb 6.4 oz (99.973 kg)    Body mass index is 31.62 kg/(m^2).   General: The patient is well-developed and well-nourished and in no acute distress   Neurologic Exam  Mental status: The patient is alert and oriented x 3 at the time of the examination. The patient has apparent normal recent and remote memory, with an apparently normal attention span and concentration ability.   Speech is normal.  Cranial nerves: Extraocular movements are full.  There is good facial sensation to soft touch bilaterally.Facial strength is normal.  Trapezius and sternocleidomastoid strength is normal. No dysarthria is noted.  The tongue is midline, and the patient has symmetric elevation of the soft palate. No obvious hearing deficits are noted.  Motor:  Muscle bulk is normal.   Tone is normal. Strength is  5 / 5 in all 4 extremities.   Sensory: Sensory testing is intact to touch and vibration sensation in all 4 extremities.  Coordination: Cerebellar testing reveals good finger-nose-finger   bilaterally.  Gait and station: Station is normal.   Gait is normal. Tandem gait is mildly wide and Romberg is negative.   Reflexes: Deep tendon reflexes are symmetric and normal  bilaterally.      DIAGNOSTIC DATA (LABS, IMAGING, TESTING) - I reviewed patient records, labs, notes, testing and imaging myself where available.     ASSESSMENT AND PLAN  Multiple sclerosis (HCC)  Transverse myelitis (HCC)  High risk medication use  1.  Continue Tecfidera. I will check her CBC and differential to make sure that she does not have lymphopenia.    She also has thalassemia and we can forward labs if she wants.   Consider getting an MRI of the brain in 2017 to rule out subclinical progression. She appears stable at this time. 2.  Continue vitamin D supplements at 4000 units daily. 3.  She will remain active and exercises tolerated. 4.  Return to clinic 6 months or sooner if there are new or worsening neurologic symptoms.   Alyssha Housh A. Epimenio Foot, MD, PhD 02/25/2015, 9:46 AM Certified in Neurology, Clinical Neurophysiology, Sleep Medicine, Pain Medicine and Neuroimaging  [facility] [address], [place], [place] [address] [phone]

## 2015-02-26 LAB — CBC WITH DIFFERENTIAL/PLATELET
BASOS ABS: 0 10*3/uL (ref 0.0–0.2)
BASOS: 0 %
EOS (ABSOLUTE): 0.1 10*3/uL (ref 0.0–0.4)
Eos: 3 %
Hematocrit: 36.8 % (ref 34.0–46.6)
Hemoglobin: 12.1 g/dL (ref 11.1–15.9)
IMMATURE GRANS (ABS): 0 10*3/uL (ref 0.0–0.1)
IMMATURE GRANULOCYTES: 0 %
LYMPHS: 19 %
Lymphocytes Absolute: 0.7 10*3/uL (ref 0.7–3.1)
MCH: 24.1 pg — ABNORMAL LOW (ref 26.6–33.0)
MCHC: 32.9 g/dL (ref 31.5–35.7)
MCV: 73 fL — ABNORMAL LOW (ref 79–97)
MONOCYTES: 13 %
Monocytes Absolute: 0.4 10*3/uL (ref 0.1–0.9)
NEUTROS PCT: 65 %
Neutrophils Absolute: 2.3 10*3/uL (ref 1.4–7.0)
PLATELETS: 310 10*3/uL (ref 150–379)
RBC: 5.03 x10E6/uL (ref 3.77–5.28)
RDW: 15.7 % — ABNORMAL HIGH (ref 12.3–15.4)
WBC: 3.5 10*3/uL (ref 3.4–10.8)

## 2015-03-01 ENCOUNTER — Encounter: Payer: Self-pay | Admitting: *Deleted

## 2015-03-01 ENCOUNTER — Telehealth: Payer: Self-pay | Admitting: *Deleted

## 2015-03-01 NOTE — Telephone Encounter (Signed)
-----   Message from Asa Lente, MD sent at 02/26/2015 12:20 PM EST ----- Blood work is okay. The lymphocyte count is slightly low but not low enough to be concerned. We will recheck again at her next visit with [place].

## 2015-03-01 NOTE — Telephone Encounter (Signed)
No # to reach pt., so I have mailed a letter to her home address advising that the labwork she had done in our office last week was ok--lymphocytes slightly low, but not enough to cause concern at this time--will recheck at her next reg. sched. ov.  She should call if she has questions/fim

## 2015-04-28 ENCOUNTER — Other Ambulatory Visit: Payer: Self-pay | Admitting: *Deleted

## 2015-04-28 MED ORDER — DIMETHYL FUMARATE 120 & 240 MG PO MISC: 240.0000 mg | Freq: Two times a day (BID) | ORAL | Status: DC

## 2015-04-28 NOTE — Telephone Encounter (Signed)
Tecfidera r/f escribed to [facility] per faxed request/fim

## 2015-05-06 DIAGNOSIS — L723 Sebaceous cyst: Secondary | ICD-10-CM | POA: Diagnosis not present

## 2015-05-06 DIAGNOSIS — E559 Vitamin D deficiency, unspecified: Secondary | ICD-10-CM | POA: Diagnosis not present

## 2015-05-06 DIAGNOSIS — D569 Thalassemia, unspecified: Secondary | ICD-10-CM | POA: Diagnosis not present

## 2015-05-06 DIAGNOSIS — G35 Multiple sclerosis: Secondary | ICD-10-CM | POA: Diagnosis not present

## 2015-05-06 DIAGNOSIS — Z6832 Body mass index (BMI) 32.0-32.9, adult: Secondary | ICD-10-CM | POA: Diagnosis not present

## 2015-05-06 DIAGNOSIS — E7800 Pure hypercholesterolemia, unspecified: Secondary | ICD-10-CM | POA: Diagnosis not present

## 2015-05-06 DIAGNOSIS — I10 Essential (primary) hypertension: Secondary | ICD-10-CM | POA: Diagnosis not present

## 2015-05-13 DIAGNOSIS — L723 Sebaceous cyst: Secondary | ICD-10-CM | POA: Diagnosis not present

## 2015-05-31 ENCOUNTER — Encounter: Payer: Self-pay | Admitting: *Deleted

## 2015-05-31 ENCOUNTER — Telehealth: Payer: Self-pay | Admitting: Neurology

## 2015-05-31 NOTE — Telephone Encounter (Addendum)
Bonita Quin with [facility] @[phone] [phone] is calling and stating that the Rx tecfidera 240 mg needs a PA to be sent to [facility] @[phone].  Patient #[id number].

## 2015-05-31 NOTE — Telephone Encounter (Signed)
PA has already been completed and sent to [facility]/fim

## 2015-08-26 ENCOUNTER — Ambulatory Visit (INDEPENDENT_AMBULATORY_CARE_PROVIDER_SITE_OTHER): Payer: Medicare Other | Admitting: Neurology

## 2015-08-26 ENCOUNTER — Encounter: Payer: Self-pay | Admitting: Neurology

## 2015-08-26 VITALS — BP 140/78 | HR 70 | Resp 16 | Ht 70.0 in | Wt 215.5 lb

## 2015-08-26 DIAGNOSIS — Z79899 Other long term (current) drug therapy: Secondary | ICD-10-CM | POA: Diagnosis not present

## 2015-08-26 DIAGNOSIS — G35 Multiple sclerosis: Secondary | ICD-10-CM | POA: Diagnosis not present

## 2015-08-26 DIAGNOSIS — G373 Acute transverse myelitis in demyelinating disease of central nervous system: Secondary | ICD-10-CM

## 2015-08-26 NOTE — Progress Notes (Signed)
[facility]  PATIENT: Margaret Henderson DOB: [birth year]  REFERRING DOCTOR OR PCP:  Dr. Lonie Peak [facility]), Neuro:  Lincoln Brigham SOURCE: paitent and records form [facility]  _________________________________   HISTORICAL  CHIEF COMPLAINT:  Chief Complaint  Patient presents with  . Multiple Sclerosis    Sts. she continues to tolerate Tecfidera well.  Denies new or worsening sx./fim    HISTORY OF PRESENT ILLNESS:  Margaret Henderson is a 50 yo woman with RRMS.   She started Tecfidera in 2016 and is tolerating it well.    She never had any GI symptoms. At first she had flushing but none recently.    She denies any new exacerbation.   She takes Vit D supplements daily.     She is active and goes to the gym 3 times a day.     Gait/strength/sensation: Gait is doing well and she has no difficulty with balance. She has no difficulty climbing stairs. She denies any numbness or weakness in the arms or legs. There is no problems with coordination.     Bladder: She denies any difficulty with bladder or bowel function.   She denies nocturia.   No constipation.    Vision: She denies any history of optic neuritis or diplopia. She has no current visual problems.  Mood/cognition:  She denies any difficulty with depression or anxiety. She has not noted any significant cognitive dysfunction. She notes no major problems with memory or processing speed or word finding.    Fatigue/sleep:   She has occasional fatigue but most days none or mild.    Sometimes she has some later in the day at work if she has longer hours.   A little rest helps her feel better.  She usually does not have heat intolerance.   She feels that she sleeps well at night.  She is getting 6-7 hours of sleep  MS History:  She was diagnosed with multiple sclerosis in 2002 after presenting with numbness below her waist and clumsiness in the arms and legs. At that time, she was found to have an MRI consistent with  multiple sclerosis. She also underwent a lumbar puncture and CSF was  consistent with multiple sclerosis. She was started on Betaseron and has continued on Betaseron. Last year, an MRI of the brain showed 2 new foci not present in her earlier MRI. She had MRI's mid 2016 at [place]. . The brain and cervical spine were unchanged but her thoracic spine showed a new focus compared to 2015.   Other:   She has thalassemia and needs to take iron.    Last HgB was 11.8.     REVIEW OF SYSTEMS: Constitutional: No fevers, chills, sweats, or change in appetite.   Rare fatigue Eyes: No visual changes, double vision, eye pain Ear, nose and throat: No hearing loss, ear pain, nasal congestion, sore throat Cardiovascular: No chest pain, palpitations Respiratory: No shortness of breath at rest or with exertion.   No wheezes GastrointestinaI: No nausea, vomiting, diarrhea, abdominal pain, fecal incontinence Genitourinary: No dysuria, urinary retention or frequency.  No nocturia. Musculoskeletal: No neck pain, back pain Integumentary: No rash, pruritus, skin lesions Neurological: as above Psychiatric: No depression at this time.  No anxiety Endocrine: No palpitations, diaphoresis, change in appetite, change in weigh or increased thirst Hematologic/Lymphatic: No anemia, purpura, petechiae. Allergic/Immunologic: No itchy/runny eyes, nasal congestion, recent allergic reactions, rashes  ALLERGIES: No Known Allergies  HOME MEDICATIONS:  Current outpatient prescriptions:  .  amLODipine (NORVASC) 5 MG tablet, Take 5 mg by mouth daily., Disp: , Rfl: 4 .  cholecalciferol (VITAMIN D) 1000 UNITS tablet, Take 2,000 Units by mouth 2 (two) times daily., Disp: , Rfl:  .  Dimethyl Fumarate 240 MG CPDR, Take 240 mg by mouth 2 (two) times daily., Disp: , Rfl:  .  atorvastatin (LIPITOR) 40 MG tablet, Take 40 mg by mouth daily. Reported on 08/26/2015, Disp: , Rfl: 5  PAST MEDICAL HISTORY: Past Medical History  Diagnosis  Date  . Hypertension   . Multiple sclerosis (HCC)   . Vision abnormalities     PAST SURGICAL HISTORY: Past Surgical History  Procedure Laterality Date  . Tubal ligation  1994    FAMILY HISTORY: Family History  Problem Relation Age of Onset  . Anemia Mother   . Diabetes Mother   . Hypertension Mother     SOCIAL HISTORY:  Social History   Social History  . Marital Status: Single    Spouse Name: N/A  . Number of Children: 2  . Years of Education: N/A   Occupational History  . Medical records    Social History Main Topics  . Smoking status: Never Smoker   . Smokeless tobacco: Not on file  . Alcohol Use: No  . Drug Use: No  . Sexual Activity: Not on file   Other Topics Concern  . Not on file   Social History Narrative     PHYSICAL EXAM  Filed Vitals:   08/26/15 0947  BP: 140/78  Pulse: 70  Resp: 16  Height: 5\' 10"  (1.778 m)  Weight: 215 lb 8 oz (97.75 kg)    Body mass index is 30.92 kg/(m^2).   General: The patient is well-developed and well-nourished and in no acute distress   Neurologic Exam  Mental status: The patient is alert and oriented x 3 at the time of the examination. The patient has apparent normal recent and remote memory, with an apparently normal attention span and concentration ability.   Speech is normal.  Cranial nerves: Extraocular movements are full.  There is good facial sensation to soft touch bilaterally.Facial strength is normal.  Trapezius and sternocleidomastoid strength is normal. No dysarthria is noted.  The tongue is midline, and the patient has symmetric elevation of the soft palate. No obvious hearing deficits are noted.  Motor:  Muscle bulk is normal.   Tone is normal. Strength is  5 / 5 in all 4 extremities.   Sensory: Sensory testing is intact to touch and vibration sensation in all 4 extremities.  Coordination: Cerebellar testing reveals good finger-nose-finger bilaterally.  Gait and station: Station is normal.    Gait is normal. Tandem gait is normal and Romberg is negative.   Reflexes: Deep tendon reflexes are symmetric and normal bilaterally.      DIAGNOSTIC DATA (LABS, IMAGING, TESTING) - I reviewed patient records, labs, notes, testing and imaging myself where available.     ASSESSMENT AND PLAN  Multiple sclerosis (HCC)  Transverse myelitis (HCC)  High risk medication use   1.  Continue Tecfidera. I will check her CBC and differential to make sure that she does not have lymphopenia.    MRI of the brain in 2017 to rule out subclinical progression. If subclinical progression as occurring, we will need to consider a switch to a more efficacious medication. 2.  Continue vitamin D supplements at 4000 units daily. 3.  She will remain active and exercises tolerated. 4.  Return to clinic 6 months  or sooner if there are new or worsening neurologic symptoms.   Doraine Schexnider A. Epimenio Foot, MD, PhD 08/26/2015, 9:56 AM Certified in Neurology, Clinical Neurophysiology, Sleep Medicine, Pain Medicine and Neuroimaging  [facility] [address] [place], [place] [address] [phone]  Orders Placed This Encounter  Procedures  . MR Brain W Wo Contrast    Standing Status: Future     Number of Occurrences:      Standing Expiration Date: 10/25/2016    Order Specific Question:  If indicated for the ordered procedure, I authorize the administration of contrast media per Radiology protocol    Answer:  Yes    Order Specific Question:  Reason for Exam (SYMPTOM  OR DIAGNOSIS REQUIRED)    Answer:  MS    Order Specific Question:  Preferred imaging location?    Answer:  [facility] (table limit-550lbs)    Order Specific Question:  What is the patient's sedation requirement?    Answer:  No Sedation    Order Specific Question:  Does the patient have a pacemaker or implanted devices?    Answer:  No  . CBC with Differential/Platelets

## 2015-08-27 LAB — CBC WITH DIFFERENTIAL/PLATELET
Basophils Absolute: 0 10*3/uL (ref 0.0–0.2)
Basos: 0 %
EOS (ABSOLUTE): 0.1 10*3/uL (ref 0.0–0.4)
Eos: 3 %
HEMATOCRIT: 36.6 % (ref 34.0–46.6)
Hemoglobin: 11.6 g/dL (ref 11.1–15.9)
Immature Grans (Abs): 0 10*3/uL (ref 0.0–0.1)
Immature Granulocytes: 0 %
LYMPHS ABS: 0.6 10*3/uL — AB (ref 0.7–3.1)
Lymphs: 17 %
MCH: 24 pg — ABNORMAL LOW (ref 26.6–33.0)
MCHC: 31.7 g/dL (ref 31.5–35.7)
MCV: 76 fL — ABNORMAL LOW (ref 79–97)
MONOCYTES: 16 %
Monocytes Absolute: 0.5 10*3/uL (ref 0.1–0.9)
NEUTROS ABS: 2.1 10*3/uL (ref 1.4–7.0)
Neutrophils: 64 %
Platelets: 298 10*3/uL (ref 150–379)
RBC: 4.84 x10E6/uL (ref 3.77–5.28)
RDW: 17.1 % — ABNORMAL HIGH (ref 12.3–15.4)
WBC: 3.3 10*3/uL — ABNORMAL LOW (ref 3.4–10.8)

## 2015-08-30 ENCOUNTER — Telehealth: Payer: Self-pay | Admitting: *Deleted

## 2015-08-30 NOTE — Telephone Encounter (Signed)
I have spoken with Margaret Henderson this afternoon and per RAS, advised that labs are ok--lymphocytes are some low but that is b/c she is on Tecfidera.  Will check again at next ov.  She verbalized understanding of same/fim

## 2015-08-30 NOTE — Telephone Encounter (Signed)
-----   Message from Asa Lente, MD sent at 08/27/2015 10:52 AM EDT ----- Please let her know that the labs look ok   (lymphocytes are minimally low but we can see that with Tecfidera.   She will need to have another blood test at the next visit.

## 2015-09-14 ENCOUNTER — Ambulatory Visit
Admission: RE | Admit: 2015-09-14 | Discharge: 2015-09-14 | Disposition: A | Discharge status: Home / Self Care | Payer: Medicare Other | Source: Ambulatory Visit | Attending: Neurology | Admitting: Neurology

## 2015-09-14 DIAGNOSIS — G373 Acute transverse myelitis in demyelinating disease of central nervous system: Secondary | ICD-10-CM

## 2015-09-14 DIAGNOSIS — G35 Multiple sclerosis: Secondary | ICD-10-CM

## 2015-09-14 MED ORDER — GADOBENATE DIMEGLUMINE 529 MG/ML IV SOLN
20.0000 mL | Freq: Once | INTRAVENOUS | Status: AC | PRN
  Administered 2015-09-14: 20 mL via INTRAVENOUS

## 2015-09-16 ENCOUNTER — Telehealth: Payer: Self-pay

## 2015-09-16 NOTE — Telephone Encounter (Signed)
I spoke to pt and advised her that per Dr. Epimenio Foot, there are no new lesions on her MRI brain. Pt verbalized understanding of results. Pt had no questions at this time but was encouraged to call back if questions arise.

## 2015-09-16 NOTE — Telephone Encounter (Signed)
-----   Message from Asa Lente, MD sent at 09/15/2015  5:35 PM EDT ----- Please let her know that the MRI of the brain does not show any new lesions compared to her last MRI from 2016.

## 2015-09-16 NOTE — Telephone Encounter (Signed)
I called pt to discuss pt's MRI results. No answer, left a message at this number ([phone]) asking pt to give me a call back. (This is the number I found in the chart.)

## 2015-12-16 DIAGNOSIS — E559 Vitamin D deficiency, unspecified: Secondary | ICD-10-CM | POA: Diagnosis not present

## 2015-12-16 DIAGNOSIS — Z1389 Encounter for screening for other disorder: Secondary | ICD-10-CM | POA: Diagnosis not present

## 2015-12-16 DIAGNOSIS — I10 Essential (primary) hypertension: Secondary | ICD-10-CM | POA: Diagnosis not present

## 2015-12-16 DIAGNOSIS — Z1231 Encounter for screening mammogram for malignant neoplasm of breast: Secondary | ICD-10-CM | POA: Diagnosis not present

## 2015-12-16 DIAGNOSIS — E7800 Pure hypercholesterolemia, unspecified: Secondary | ICD-10-CM | POA: Diagnosis not present

## 2015-12-16 DIAGNOSIS — Z0000 Encounter for general adult medical examination without abnormal findings: Secondary | ICD-10-CM | POA: Diagnosis not present

## 2015-12-16 DIAGNOSIS — Z124 Encounter for screening for malignant neoplasm of cervix: Secondary | ICD-10-CM | POA: Diagnosis not present

## 2015-12-16 DIAGNOSIS — K219 Gastro-esophageal reflux disease without esophagitis: Secondary | ICD-10-CM | POA: Diagnosis not present

## 2015-12-16 DIAGNOSIS — G35 Multiple sclerosis: Secondary | ICD-10-CM | POA: Diagnosis not present

## 2015-12-16 DIAGNOSIS — D569 Thalassemia, unspecified: Secondary | ICD-10-CM | POA: Diagnosis not present

## 2015-12-21 DIAGNOSIS — H40013 Open angle with borderline findings, low risk, bilateral: Secondary | ICD-10-CM | POA: Diagnosis not present

## 2015-12-30 DIAGNOSIS — Z1231 Encounter for screening mammogram for malignant neoplasm of breast: Secondary | ICD-10-CM | POA: Diagnosis not present

## 2016-01-13 DIAGNOSIS — R928 Other abnormal and inconclusive findings on diagnostic imaging of breast: Secondary | ICD-10-CM | POA: Diagnosis not present

## 2016-02-24 ENCOUNTER — Ambulatory Visit: Payer: Medicare Other | Admitting: Neurology

## 2016-03-02 ENCOUNTER — Encounter: Payer: Self-pay | Admitting: Neurology

## 2016-03-02 ENCOUNTER — Ambulatory Visit (INDEPENDENT_AMBULATORY_CARE_PROVIDER_SITE_OTHER): Payer: Medicare Other | Admitting: Neurology

## 2016-03-02 VITALS — BP 140/86 | HR 68 | Resp 18 | Ht 70.0 in | Wt 207.0 lb

## 2016-03-02 DIAGNOSIS — G35 Multiple sclerosis: Secondary | ICD-10-CM

## 2016-03-02 DIAGNOSIS — Z79899 Other long term (current) drug therapy: Secondary | ICD-10-CM

## 2016-03-02 DIAGNOSIS — G35D Multiple sclerosis, unspecified: Secondary | ICD-10-CM

## 2016-03-02 DIAGNOSIS — G373 Acute transverse myelitis in demyelinating disease of central nervous system: Secondary | ICD-10-CM

## 2016-03-02 NOTE — Progress Notes (Signed)
[facility]  PATIENT: Margaret Henderson DOB: 15-Nov-1965  REFERRING DOCTOR OR PCP:  Dr. Lonie Peak [facility]),  SOURCE: paitent and [facility] form [facility]  _________________________________   HISTORICAL  CHIEF COMPLAINT:  Chief Complaint  Patient presents with  . Multiple Sclerosis    Sts. she continues to tolerate Tecfidera well.  Denies new or worsening sx./fim    HISTORY OF PRESENT ILLNESS:  Margaret Henderson is a 50 yo woman with RRMS.     MS:  She started Tecfidera in 2016 and is tolerating it well.    She never had any GI symptoms and flushing issues resolved after a couple months.    She denies any new exacerbation.   She is active and goes to the gym now and then and walks most days.    She takes Vit D supplements daily.      Gait/strength/sensation: Gait is doing well and she has no difficulty with balance. She has no difficulty climbing stairs. She denies any numbness or weakness in the arms or legs. There is no problems with coordination.     Bladder: She denies any difficulty with bladder or bowel function.   She denies nocturia.   She does not have constipation.    Vision: She denies any history of optic neuritis or diplopia. She has no current visual problems.  Mood/cognition:  She denies any difficulty with depression or anxiety. She has not noted any significant cognitive dysfunction. She notes no major problems with memory or processing speed or word finding.    Fatigue/sleep:   She has occasional fatigue in the afternoons but most days none or mild.     She usually does not have heat intolerance.   She feels that she sleeps well at night.  She is getting 6-7 hours of sleep  MS History:  She was diagnosed with multiple sclerosis in 2002 after presenting with numbness below her waist and clumsiness in the arms and legs. At that time, she was found to have an MRI consistent with multiple sclerosis. She also underwent a lumbar puncture and CSF was   consistent with multiple sclerosis. She was started on Betaseron and has continued on Betaseron. Last year, an MRI of the brain showed 2 new foci not present in her earlier MRI. She had MRI's mid 2016 at [place]. . The brain and cervical spine were unchanged but her thoracic spine showed a new focus compared to 2015.   Other:   She has thalassemia and needs to take iron.    Last HgB was 11.8.        REVIEW OF SYSTEMS: Constitutional: No fevers, chills, sweats, or change in appetite.   Rare fatigue Eyes: No visual changes, double vision, eye pain Ear, nose and throat: No hearing loss, ear pain, nasal congestion, sore throat Cardiovascular: No chest pain, palpitations Respiratory: No shortness of breath at rest or with exertion.   No wheezes GastrointestinaI: No nausea, vomiting, diarrhea, abdominal pain, fecal incontinence Genitourinary: No dysuria, urinary retention or frequency.  No nocturia. Musculoskeletal: No neck pain, back pain Integumentary: No rash, pruritus, skin lesions Neurological: as above Psychiatric: No depression at this time.  No anxiety Endocrine: No palpitations, diaphoresis, change in appetite, change in weigh or increased thirst Hematologic/Lymphatic: No anemia, purpura, petechiae. Allergic/Immunologic: No itchy/runny eyes, nasal congestion, recent allergic reactions, rashes  ALLERGIES: No Known Allergies  HOME MEDICATIONS:  Current Outpatient Prescriptions:  .  amLODipine (NORVASC) 5 MG tablet, Take 5 mg by mouth  daily., Disp: , Rfl: 4 .  cholecalciferol (VITAMIN D) 1000 UNITS tablet, Take 2,000 Units by mouth 2 (two) times daily., Disp: , Rfl:  .  Dimethyl Fumarate 240 MG CPDR, Take 240 mg by mouth 2 (two) times daily., Disp: , Rfl:   PAST MEDICAL HISTORY: Past Medical History:  Diagnosis Date  . Hypertension   . Multiple sclerosis (HCC)   . Vision abnormalities     PAST SURGICAL HISTORY: Past Surgical History:  Procedure Laterality Date  . TUBAL  LIGATION  1994    FAMILY HISTORY: Family History  Problem Relation Age of Onset  . Anemia Mother   . Diabetes Mother   . Hypertension Mother     SOCIAL HISTORY:  Social History   Social History  . Marital status: Single    Spouse name: N/A  . Number of children: 2  . Years of education: N/A   Occupational History  . Medical records    Social History Main Topics  . Smoking status: Never Smoker  . Smokeless tobacco: Not on file  . Alcohol use No  . Drug use: No  . Sexual activity: Not on file   Other Topics Concern  . Not on file   Social History Narrative  . No narrative on file     PHYSICAL EXAM  Vitals:   03/02/16 1143  BP: 140/86  Pulse: 68  Resp: 18  Weight: 207 lb (93.9 kg)  Height: 5\' 10"  (1.778 m)    Body mass index is 29.7 kg/m.   General: The patient is well-developed and well-nourished and in no acute distress   Neurologic Exam  Mental status: The patient is alert and oriented x 3 at the time of the examination. The patient has apparent normal recent and remote memory, with an apparently normal attention span and concentration ability.   Speech is normal.  Cranial nerves: Extraocular movements are full.  There is good facial sensation to soft touch bilaterally.Facial strength is normal.  Trapezius and sternocleidomastoid strength is normal. No dysarthria is noted.  The tongue is midline, and the patient has symmetric elevation of the soft palate. No obvious hearing deficits are noted.  Motor:  Muscle bulk is normal.   Tone is normal. Strength is  5 / 5 in all 4 extremities.   Sensory: Sensory testing is intact to touch and vibration sensation in all 4 extremities.  Coordination: Cerebellar testing reveals good finger-nose-finger bilaterally.  Gait and station: Station is normal.   Gait is normal. Tandem gait is normal and Romberg is negative.   Reflexes: Deep tendon reflexes are symmetric and normal bilaterally.      DIAGNOSTIC DATA  (LABS, IMAGING, TESTING) - I reviewed patient records, labs, notes, testing and imaging myself where available.     ASSESSMENT AND PLAN  Multiple sclerosis (HCC) - Plan: CBC with Differential/Platelet  Transverse myelitis (HCC)  High risk medication use - Plan: CBC with Differential/Platelet   1.  Continue Tecfidera.   Check CBC and differential to make sure that she does not have lymphopenia.     2.  Continue vitamin D supplements at 4000 units daily. 3.  She will remain active and exercises tolerated. 4.  Return to clinic 6 months or sooner if there are new or worsening neurologic symptoms.   Richard A. Epimenio Foot, MD, PhD 03/02/2016, 2:23 PM Certified in Neurology, Clinical Neurophysiology, Sleep Medicine, Pain Medicine and Neuroimaging  [facility] [place] [facility] [address] [place], [place] [address] [phone]  Orders Placed This Encounter  Procedures  . CBC with Differential/Platelet   .

## 2016-03-03 ENCOUNTER — Other Ambulatory Visit: Payer: Self-pay | Admitting: Neurology

## 2016-03-03 ENCOUNTER — Telehealth: Payer: Self-pay | Admitting: *Deleted

## 2016-03-03 DIAGNOSIS — Z79899 Other long term (current) drug therapy: Secondary | ICD-10-CM

## 2016-03-03 DIAGNOSIS — G35 Multiple sclerosis: Secondary | ICD-10-CM

## 2016-03-03 LAB — CBC WITH DIFFERENTIAL/PLATELET
BASOS ABS: 0 10*3/uL (ref 0.0–0.2)
Basos: 0 %
EOS (ABSOLUTE): 0.1 10*3/uL (ref 0.0–0.4)
Eos: 3 %
Hematocrit: 35.2 % (ref 34.0–46.6)
Hemoglobin: 11.6 g/dL (ref 11.1–15.9)
IMMATURE GRANULOCYTES: 0 %
Immature Grans (Abs): 0 10*3/uL (ref 0.0–0.1)
Lymphocytes Absolute: 0.5 10*3/uL — ABNORMAL LOW (ref 0.7–3.1)
Lymphs: 22 %
MCH: 23.9 pg — ABNORMAL LOW (ref 26.6–33.0)
MCHC: 33 g/dL (ref 31.5–35.7)
MCV: 72 fL — AB (ref 79–97)
MONOS ABS: 0.4 10*3/uL (ref 0.1–0.9)
Monocytes: 15 %
NEUTROS PCT: 60 %
Neutrophils Absolute: 1.4 10*3/uL (ref 1.4–7.0)
PLATELETS: 328 10*3/uL (ref 150–379)
RBC: 4.86 x10E6/uL (ref 3.77–5.28)
RDW: 17.1 % — AB (ref 12.3–15.4)
WBC: 2.4 10*3/uL — AB (ref 3.4–10.8)

## 2016-03-03 NOTE — Telephone Encounter (Signed)
-----   Message from Asa Lenteichard A Sater, MD sent at 03/03/2016 10:26 AM EST ----- Please let her know that the white blood cell count was lower than I would like it to be.    We need to check again in about 4 weeks. If it is not better, we would need to consider changing Tecfidera to a different medication

## 2016-03-03 NOTE — Telephone Encounter (Signed)
I have spoken with Breonica this morning and per RAS, explained that [facility] are lower than he would like to see.  CBC should be repeated in one month.  She verbalized understanding of same, is agreeable.  I will give her a reminder call on a month when it is time to repeat labs/fim

## 2016-03-16 ENCOUNTER — Telehealth: Payer: Self-pay | Admitting: *Deleted

## 2016-03-16 NOTE — Telephone Encounter (Signed)
Received Tecfidera PA renewal request from [facility] [phone]).  I called [facility] [phone]) to complete the request.  I was informed that Tecfidera will not require a PA in 2018, for this patient's plan.  Pt's [id number] - call [phone].  I have faxed this information back to [facility] at [phone].

## 2016-03-28 DIAGNOSIS — L918 Other hypertrophic disorders of the skin: Secondary | ICD-10-CM | POA: Diagnosis not present

## 2016-03-28 DIAGNOSIS — Z6831 Body mass index (BMI) 31.0-31.9, adult: Secondary | ICD-10-CM | POA: Diagnosis not present

## 2016-03-30 ENCOUNTER — Telehealth: Payer: Self-pay | Admitting: *Deleted

## 2016-03-30 MED ORDER — DIMETHYL FUMARATE 240 MG PO CPDR: 240.0000 mg | DELAYED_RELEASE_CAPSULE | Freq: Two times a day (BID) | ORAL | 3 refills | Status: DC

## 2016-03-30 NOTE — Telephone Encounter (Signed)
Tecfidera rx. escribed to [facility] per faxed request/fim

## 2016-04-26 DIAGNOSIS — Z6830 Body mass index (BMI) 30.0-30.9, adult: Secondary | ICD-10-CM | POA: Diagnosis not present

## 2016-04-26 DIAGNOSIS — J101 Influenza due to other identified influenza virus with other respiratory manifestations: Secondary | ICD-10-CM | POA: Diagnosis not present

## 2016-08-31 ENCOUNTER — Encounter: Payer: Self-pay | Admitting: Neurology

## 2016-08-31 ENCOUNTER — Ambulatory Visit (INDEPENDENT_AMBULATORY_CARE_PROVIDER_SITE_OTHER): Payer: Medicare Other | Admitting: Neurology

## 2016-08-31 VITALS — BP 134/69 | HR 75 | Resp 16 | Ht 70.0 in | Wt 206.0 lb

## 2016-08-31 DIAGNOSIS — G35 Multiple sclerosis: Secondary | ICD-10-CM

## 2016-08-31 DIAGNOSIS — Z79899 Other long term (current) drug therapy: Secondary | ICD-10-CM | POA: Diagnosis not present

## 2016-08-31 DIAGNOSIS — G373 Acute transverse myelitis in demyelinating disease of central nervous system: Secondary | ICD-10-CM | POA: Diagnosis not present

## 2016-08-31 NOTE — Progress Notes (Signed)
[facility]  PATIENT: Margaret Henderson DOB: 26-Jul-1965  REFERRING DOCTOR OR PCP:  Dr. Lonie Peak [facility]), Neuro:  Lincoln Brigham SOURCE: paitent and records form [facility]  _________________________________   HISTORICAL  CHIEF COMPLAINT:  Chief Complaint  Patient presents with  . Multiple Sclerosis    Sts. she continues to tolerate Tecfidera well.  Denies new or worsening sx/fim    HISTORY OF PRESENT ILLNESS:  Margaret Henderson is a 51 yo woman with RRMS.     MS/transverse myelitis:   In 2016, an MRI of the brain, low she was on Betaseron, showed some new lesions. Therefore, therapy was changed and she started [place] and 2016. She is tolerating it well. She started Tecfidera in 2016 and is tolerating it well.    She never had any GI symptoms. Flushing was mild. She has not had any exacerbations.    She is active and goes to the gym daily and walks 20 miles/week.    An MRI of the brain 09/15/15 showed no lesions while on Tecfidera.     Gait/strength/sensation: Gait is doing well and she has no difficulty with balance. She has no difficulty climbing stairs.   She can climb ladders.    There is no numbness or weakness in the arms or legs. The numbness that occurred with transverse myelitis resolved. There is no problems with coordination.     Bladder: Bladder and bowel function is fine. She denies nocturia.   No constipation.    Vision: She has not had any MS related difficulties with her vision. There is no history of optic neuritis or diplopia. No problems with color vision  Mood/cognition:  Mood is fine. She denies any depression or anxiety. There is no significant cognitive dysfunction.    Fatigue/sleep:   She denies any significant problem with fatigue. She stays active and walks or exercises daily.   She usually does not have heat intolerance.   She feels that she sleeps well at night.  She is getting 6-7 hours of sleep.  Vitamin D deficiency. She  continues on supplementation.   She takes 4000 U/day  MS History:  She was diagnosed with multiple sclerosis in 2002 after presenting with numbness below her waist and clumsiness in the arms and legs. At that time, she was found to have an MRI consistent with multiple sclerosis. She also underwent a lumbar puncture and CSF was  consistent with multiple sclerosis. She was started on Betaseron and has continued on Betaseron. Last year, an MRI of the brain showed 2 new foci not present in her earlier MRI. She had MRI's mid 2016 at [place]. . The brain and cervical spine were unchanged but her thoracic spine showed a new focus compared to 2015.   Other:   She has thalassemia and needs to take iron.    Last HgB was 11.8.     REVIEW OF SYSTEMS: Constitutional: No fevers, chills, sweats, or change in appetite.   Rare fatigue Eyes: No visual changes, double vision, eye pain Ear, nose and throat: No hearing loss, ear pain, nasal congestion, sore throat Cardiovascular: No chest pain, palpitations Respiratory: No shortness of breath at rest or with exertion.   No wheezes GastrointestinaI: No nausea, vomiting, diarrhea, abdominal pain, fecal incontinence Genitourinary: No dysuria, urinary retention or frequency.  No nocturia. Musculoskeletal: No neck pain, back pain Integumentary: No rash, pruritus, skin lesions Neurological: as above Psychiatric: No depression at this time.  No anxiety Endocrine: No palpitations, diaphoresis,  change in appetite, change in weigh or increased thirst Hematologic/Lymphatic: No anemia, purpura, petechiae. Allergic/Immunologic: No itchy/runny eyes, nasal congestion, recent allergic reactions, rashes  ALLERGIES: No Known Allergies  HOME MEDICATIONS:  Current Outpatient Prescriptions:  .  amLODipine (NORVASC) 5 MG tablet, Take 5 mg by mouth daily., Disp: , Rfl: 4 .  cholecalciferol (VITAMIN D) 1000 UNITS tablet, Take 2,000 Units by mouth 2 (two) times daily., Disp: ,  Rfl:  .  Dimethyl Fumarate 240 MG CPDR, Take 1 capsule (240 mg total) by mouth 2 (two) times daily., Disp: 180 capsule, Rfl: 3 .  ferrous sulfate 325 (65 FE) MG tablet, Take 325 mg by mouth daily with breakfast., Disp: , Rfl:   PAST MEDICAL HISTORY: Past Medical History:  Diagnosis Date  . Hypertension   . Multiple sclerosis (HCC)   . Vision abnormalities     PAST SURGICAL HISTORY: Past Surgical History:  Procedure Laterality Date  . TUBAL LIGATION  1994    FAMILY HISTORY: Family History  Problem Relation Age of Onset  . Anemia Mother   . Diabetes Mother   . Hypertension Mother     SOCIAL HISTORY:  Social History   Social History  . Marital status: Single    Spouse name: N/A  . Number of children: 2  . Years of education: N/A   Occupational History  . Medical records    Social History Main Topics  . Smoking status: Never Smoker  . Smokeless tobacco: Never Used  . Alcohol use No  . Drug use: No  . Sexual activity: Not on file   Other Topics Concern  . Not on file   Social History Narrative  . No narrative on file     PHYSICAL EXAM  Vitals:   08/31/16 0956  BP: 134/69  Pulse: 75  Resp: 16  Weight: 206 lb (93.4 kg)  Height: 5\' 10"  (1.778 m)    Body mass index is 29.56 kg/m.   General: The patient is well-developed and well-nourished and in no acute distress   Neurologic Exam  Mental status: The patient is alert and oriented x 3 at the time of the examination. The patient has apparent normal recent and remote memory, with an apparently normal attention span and concentration ability.   Speech is normal.  Cranial nerves: Extraocular movements are full.  There is good facial sensation to soft touch bilaterally.Facial strength is normal.  Trapezius and sternocleidomastoid strength is normal. No dysarthria is noted.  The tongue is midline, and the patient has symmetric elevation of the soft palate. No obvious hearing deficits are noted.  Motor:   Muscle bulk is normal.   Tone is normal. Strength is  5 / 5 in all 4 extremities.   Sensory: Sensory testing is intact to touch and vibration sensation in all 4 extremities.  Coordination: Cerebellar testing reveals good finger-nose-finger bilaterally.  Gait and station: Station is normal.   Gait is normal. Tandem gait is normal and Romberg is negative.   Reflexes: Deep tendon reflexes are symmetric and normal bilaterally.      DIAGNOSTIC DATA (LABS, IMAGING, TESTING) - I reviewed patient records, labs, notes, testing and imaging myself where available.     ASSESSMENT AND PLAN  Multiple sclerosis (HCC), Chronic - Plan: CBC with Differential/Platelet  Transverse myelitis (HCC), Chronic  High risk medication use - Plan: CBC with Differential/Platelet  1.  She will continue Tecfidera 240 mg twice a day.  Check CBC with differential today. Her lymphocyte count was  low 6 months ago. If repeat lymphocyte count today is also low, we will need to consider a change to different medication to reduce the risk of severe infection such as PML. 2.  Continue vitamin D supplements at 4000 units daily. 3.  She will remain active and exercises tolerated. 4.  Return to clinic 6 months or sooner if there are new or worsening neurologic symptoms.   Richard A. Epimenio Foot, MD, PhD, [place]    08/31/16   10:32 AM Certified in Neurology, Clinical Neurophysiology, Sleep Medicine, Pain Medicine and Neuroimaging Director, [facility] S[facility] at [facility]  [facility] [address], [address] [place], [place] [address] [phone]    Orders Placed This Encounter  Procedures  . CBC with Differential/Platelet

## 2016-09-01 ENCOUNTER — Telehealth: Payer: Self-pay | Admitting: *Deleted

## 2016-09-01 LAB — CBC WITH DIFFERENTIAL/PLATELET
BASOS ABS: 0 10*3/uL (ref 0.0–0.2)
BASOS: 0 %
EOS (ABSOLUTE): 0 10*3/uL (ref 0.0–0.4)
Eos: 1 %
HEMOGLOBIN: 11.7 g/dL (ref 11.1–15.9)
Hematocrit: 37 % (ref 34.0–46.6)
IMMATURE GRANS (ABS): 0 10*3/uL (ref 0.0–0.1)
IMMATURE GRANULOCYTES: 0 %
LYMPHS: 20 %
Lymphocytes Absolute: 0.7 10*3/uL (ref 0.7–3.1)
MCH: 23.9 pg — AB (ref 26.6–33.0)
MCHC: 31.6 g/dL (ref 31.5–35.7)
MCV: 76 fL — ABNORMAL LOW (ref 79–97)
MONOCYTES: 10 %
Monocytes Absolute: 0.3 10*3/uL (ref 0.1–0.9)
NEUTROS ABS: 2.3 10*3/uL (ref 1.4–7.0)
NEUTROS PCT: 69 %
PLATELETS: 284 10*3/uL (ref 150–379)
RBC: 4.9 x10E6/uL (ref 3.77–5.28)
RDW: 17.5 % — ABNORMAL HIGH (ref 12.3–15.4)
WBC: 3.3 10*3/uL — ABNORMAL LOW (ref 3.4–10.8)

## 2016-09-01 NOTE — Telephone Encounter (Signed)
Spoke with patient and informed her that the lab work is fine. Her WBC count and lymphocytes are better than 6 months ago. Patient verbalized understanding, was thankful for improvement.

## 2017-02-13 ENCOUNTER — Telehealth: Payer: Self-pay | Admitting: *Deleted

## 2017-02-13 NOTE — Telephone Encounter (Signed)
I rescheduled patient's 03-02-17 appointment with Dr. Epimenio Foot to 05-04-16 @ 8:30am.

## 2017-02-13 NOTE — Telephone Encounter (Signed)
Noted/fim 

## 2017-02-13 NOTE — Telephone Encounter (Signed)
LMTC.  I need to r/s her 03/02/17 appt. with RAS.  He will be out of the office that day/fim

## 2017-03-01 ENCOUNTER — Other Ambulatory Visit: Payer: Self-pay | Admitting: Neurology

## 2017-03-02 ENCOUNTER — Ambulatory Visit: Payer: Medicare Other | Admitting: Neurology

## 2017-05-04 ENCOUNTER — Encounter: Payer: Self-pay | Admitting: Neurology

## 2017-05-04 ENCOUNTER — Other Ambulatory Visit: Payer: Self-pay

## 2017-05-04 ENCOUNTER — Ambulatory Visit (INDEPENDENT_AMBULATORY_CARE_PROVIDER_SITE_OTHER): Payer: Medicare Other | Admitting: Neurology

## 2017-05-04 VITALS — BP 118/58 | HR 76 | Resp 18 | Ht 70.0 in | Wt 214.0 lb

## 2017-05-04 DIAGNOSIS — G35 Multiple sclerosis: Secondary | ICD-10-CM

## 2017-05-04 DIAGNOSIS — G373 Acute transverse myelitis in demyelinating disease of central nervous system: Secondary | ICD-10-CM | POA: Diagnosis not present

## 2017-05-04 DIAGNOSIS — Z79899 Other long term (current) drug therapy: Secondary | ICD-10-CM

## 2017-05-04 NOTE — Progress Notes (Signed)
[facility]  PATIENT: [name] DOB: 1965/04/13  REFERRING DOCTOR OR PCP:  Dr. Lonie Peak [facility]), Neuro:  Lincoln Brigham SOURCE: paitent and records form [facility]  _________________________________   HISTORICAL  CHIEF COMPLAINT:  Chief Complaint  Patient presents with  . Multiple Sclerosis    Sts. she continues to tolerate Tecfidera well. Denies new or worsening sx./fim    HISTORY OF PRESENT ILLNESS:  Margaret Henderson is a 52 yo woman with RRMS.     Update 05/04/2017: She feels her MS is doing well. Specifically she has not had any exacerbations or new impairments. She is on Tecfidera and tolerates it well. She has had some borderline lymphocyte readings understands that if she gets a couple of low readings in a row we might consider a different disease modifying therapy.  She denies any significant impairments. Specifically there is no difficulty with her gait and she can easily walk a mile she had 2. There is no numbness or weakness. Bladder function is fine. Vision is fine. She has a little bit of fatigue now and then but no more than people she knows. It does not interfere with getting tasks done. She denies any depression. She sleeps well. Cognition is fine.  From 08/25/16: MS/transverse myelitis:   In 2016, an MRI of the brain, low she was on Betaseron, showed some new lesions. Therefore, therapy was changed and she started [place] and 2016. She is tolerating it well. She started Tecfidera in 2016 and is tolerating it well.    She never had any GI symptoms. Flushing was mild. She has not had any exacerbations.    She is active and goes to the gym daily and walks 20 miles/week.    An MRI of the brain 09/15/15 showed no lesions while on Tecfidera.     Gait/strength/sensation: Gait is doing well and she has no difficulty with balance. She has no difficulty climbing stairs.   She can climb ladders.    There is no numbness or weakness in the arms or  legs. The numbness that occurred with transverse myelitis resolved. There is no problems with coordination.     Bladder: Bladder and bowel function is fine. She denies nocturia.   No constipation.    Vision: She has not had any MS related difficulties with her vision. There is no history of optic neuritis or diplopia. No problems with color vision  Mood/cognition:  Mood is fine. She denies any depression or anxiety. There is no significant cognitive dysfunction.    Fatigue/sleep:   She denies any significant problem with fatigue. She stays active and walks or exercises daily.   She usually does not have heat intolerance.   She feels that she sleeps well at night.  She is getting 6-7 hours of sleep.  Vitamin D deficiency. She continues on supplementation.   She takes 4000 U/day  MS History:  She was diagnosed with multiple sclerosis in 2002 after presenting with numbness below her waist and clumsiness in the arms and legs. At that time, she was found to have an MRI consistent with multiple sclerosis. She also underwent a lumbar puncture and CSF was  consistent with multiple sclerosis. She was started on Betaseron and has continued on Betaseron. Last year, an MRI of the brain showed 2 new foci not present in her earlier MRI. She had MRI's mid 2016 at [place]. . The brain and cervical spine were unchanged but her thoracic spine showed a new focus compared  to 2015.   Other:   She has thalassemia and needs to take iron.    Last HgB was 11.8.     REVIEW OF SYSTEMS: Constitutional: No fevers, chills, sweats, or change in appetite.   Rare fatigue Eyes: No visual changes, double vision, eye pain Ear, nose and throat: No hearing loss, ear pain, nasal congestion, sore throat Cardiovascular: No chest pain, palpitations Respiratory: No shortness of breath at rest or with exertion.   No wheezes GastrointestinaI: No nausea, vomiting, diarrhea, abdominal pain, fecal incontinence Genitourinary: No dysuria,  urinary retention or frequency.  No nocturia. Musculoskeletal: No neck pain, back pain Integumentary: No rash, pruritus, skin lesions Neurological: as above Psychiatric: No depression at this time.  No anxiety Endocrine: No palpitations, diaphoresis, change in appetite, change in weigh or increased thirst Hematologic/Lymphatic: No anemia, purpura, petechiae. Allergic/Immunologic: No itchy/runny eyes, nasal congestion, recent allergic reactions, rashes  ALLERGIES: No Known Allergies  HOME MEDICATIONS:  Current Outpatient Medications:  .  amLODipine (NORVASC) 5 MG tablet, Take 5 mg by mouth daily., Disp: , Rfl: 4 .  Biotin 1000 MCG tablet, Take 1,000 mcg by mouth 3 (three) times daily., Disp: , Rfl:  .  cholecalciferol (VITAMIN D) 1000 UNITS tablet, Take 2,000 Units by mouth 2 (two) times daily., Disp: , Rfl:  .  ferrous sulfate 325 (65 FE) MG tablet, Take 325 mg by mouth daily with breakfast., Disp: , Rfl:  .  lactobacillus acidophilus (BACID) TABS tablet, Take 2 tablets by mouth 3 (three) times daily., Disp: , Rfl:  .  TECFIDERA 240 MG CPDR, Take 1 capsule (240mg  total) by mouth 2 (two) times daily., Disp: 180 capsule, Rfl: 3  PAST MEDICAL HISTORY: Past Medical History:  Diagnosis Date  . Hypertension   . Multiple sclerosis (HCC)   . Vision abnormalities     PAST SURGICAL HISTORY: Past Surgical History:  Procedure Laterality Date  . TUBAL LIGATION  1994    FAMILY HISTORY: Family History  Problem Relation Age of Onset  . Anemia Mother   . Diabetes Mother   . Hypertension Mother     SOCIAL HISTORY:  Social History   Socioeconomic History  . Marital status: Single    Spouse name: Not on file  . Number of children: 2  . Years of education: Not on file  . Highest education level: Not on file  Social Needs  . Financial resource strain: Not on file  . Food insecurity - worry: Not on file  . Food insecurity - inability: Not on file  . Transportation needs -  medical: Not on file  . Transportation needs - non-medical: Not on file  Occupational History  . Occupation: Medical records  Tobacco Use  . Smoking status: Never Smoker  . Smokeless tobacco: Never Used  Substance and Sexual Activity  . Alcohol use: No    Alcohol/week: 0.0 oz  . Drug use: No  . Sexual activity: Not on file  Other Topics Concern  . Not on file  Social History Narrative  . Not on file     PHYSICAL EXAM  Vitals:   05/04/17 0832  BP: (!) 118/58  Pulse: 76  Resp: 18  Weight: 214 lb (97.1 kg)  Height: 5\' 10"  (1.778 m)    Body mass index is 30.71 kg/m.   General: The patient is well-developed and well-nourished and in no acute distress   Neurologic Exam  Mental status: The patient is alert and oriented x 3 at the time of the  examination. The patient has apparent normal recent and remote memory, with an apparently normal attention span and concentration ability.   Speech is normal.  Cranial nerves: Extraocular movements are full.  Patient instructed sensation normal. Trapezius strength is normal.  The tongue is midline, and the patient has symmetric elevation of the soft palate. No obvious hearing deficits are noted.  Motor:  Muscle bulk is normal.   Muscle tone is normal. Strength is 5/5 in the arms and legs..   Sensory: Sensory testing is intact to touch and vibration sensation in all 4 extremities.  Coordination: Finger-nose-finger is normal in both arms.  Gait and station: Station is normal.   Gait is normal. Tandem gait is minimally wide and Romberg is negative.   Reflexes: Deep tendon reflexes are symmetric and normal bilaterally.      DIAGNOSTIC DATA (LABS, IMAGING, TESTING) - I reviewed patient records, labs, notes, testing and imaging myself where available.     ASSESSMENT AND PLAN  Multiple sclerosis (HCC) - Plan: MR BRAIN W WO CONTRAST, CBC with Differential/Platelet  Transverse myelitis (HCC)  High risk medication use - Plan: CBC  with Differential/Platelet  1.   Continued Tecfidera. We need to check a CBC again today. Her blood count has shown lymphocytes are low on a couple readings. However the last reading was 0.7 2.  Continue vitamin D supplements at 4000 units daily. 3.  MRI of the brain to determine if there is any subclinical progression. If present, we will need to consider a different disease modifying therapy. 4.  Return to clinic 6 months or sooner if there are new or worsening neurologic symptoms.   Amirra Herling A. Epimenio Foot, MD, PhD, [place]    05/04/17   8:58 AM Certified in Neurology, Clinical Neurophysiology, Sleep Medicine, Pain Medicine and Neuroimaging Director, [facility] S[facility] Center at [facility]  [facility] [address], [address] [place], [place] [address] [phone]    Orders Placed This Encounter  Procedures  . MR BRAIN W WO CONTRAST    Standing Status:   Future    Standing Expiration Date:   07/03/2018    Scheduling Instructions:     Patient is claustophobic, needs wide bore MRI    Order Specific Question:   If indicated for the ordered procedure, I authorize the administration of contrast media per Radiology protocol    Answer:   Yes    Order Specific Question:   What is the patient's sedation requirement?    Answer:   No Sedation    Order Specific Question:   Does the patient have a pacemaker or implanted devices?    Answer:   No    Order Specific Question:   Radiology Contrast Protocol - do NOT remove file path    Answer:   [email or web address]    Order Specific Question:   Preferred imaging location?    Answer:   [facility] (table limit-550lbs)  . CBC with Differential/Platelet

## 2017-05-05 LAB — CBC WITH DIFFERENTIAL/PLATELET
BASOS ABS: 0 10*3/uL (ref 0.0–0.2)
Basos: 0 %
EOS (ABSOLUTE): 0.1 10*3/uL (ref 0.0–0.4)
Eos: 2 %
Hematocrit: 36.3 % (ref 34.0–46.6)
Hemoglobin: 11.4 g/dL (ref 11.1–15.9)
Immature Grans (Abs): 0 10*3/uL (ref 0.0–0.1)
Immature Granulocytes: 0 %
LYMPHS ABS: 0.5 10*3/uL — AB (ref 0.7–3.1)
Lymphs: 17 %
MCH: 23.6 pg — AB (ref 26.6–33.0)
MCHC: 31.4 g/dL — AB (ref 31.5–35.7)
MCV: 75 fL — ABNORMAL LOW (ref 79–97)
Monocytes Absolute: 0.4 10*3/uL (ref 0.1–0.9)
Monocytes: 13 %
NEUTROS ABS: 1.9 10*3/uL (ref 1.4–7.0)
Neutrophils: 68 %
PLATELETS: 317 10*3/uL (ref 150–379)
RBC: 4.83 x10E6/uL (ref 3.77–5.28)
RDW: 16.8 % — AB (ref 12.3–15.4)
WBC: 2.8 10*3/uL — ABNORMAL LOW (ref 3.4–10.8)

## 2017-05-08 ENCOUNTER — Telehealth: Payer: Self-pay | Admitting: *Deleted

## 2017-05-08 NOTE — Telephone Encounter (Signed)
-----   Message from Asa Lente, MD sent at 05/07/2017  3:07 PM EST ----- Please let her know that the lymphocyte count was mildly low. We need to recheck this in 6-8 weeks and if it continues to be low we might need to consider a different disease modifying therapy (now on Tecfidera)

## 2017-05-08 NOTE — Telephone Encounter (Signed)
Spoke with Margaret Henderson and reviewed below lab results, need to repeat CBC in 2 mos.  She verbalized understanding of same, is agreeable/fim

## 2017-05-18 ENCOUNTER — Ambulatory Visit
Admission: RE | Admit: 2017-05-18 | Discharge: 2017-05-18 | Disposition: A | Discharge status: Home / Self Care | Payer: Medicare Other | Source: Ambulatory Visit | Attending: Neurology | Admitting: Neurology

## 2017-05-18 DIAGNOSIS — G35 Multiple sclerosis: Secondary | ICD-10-CM | POA: Diagnosis not present

## 2017-05-21 ENCOUNTER — Telehealth: Payer: Self-pay | Admitting: *Deleted

## 2017-05-21 NOTE — Telephone Encounter (Signed)
-----   Message from Asa Lente, MD sent at 05/18/2017  3:23 PM EST ----- Please let her know that the MRI of the brain does not show any new lesions compared to the MRI from 2017.

## 2017-05-21 NOTE — Telephone Encounter (Signed)
Spoke with Margaret Henderson and reviewed below MRI results.  She verbalized understanding of same/fim

## 2017-06-25 ENCOUNTER — Other Ambulatory Visit: Payer: Self-pay | Admitting: *Deleted

## 2017-06-25 ENCOUNTER — Telehealth: Payer: Self-pay | Admitting: *Deleted

## 2017-06-25 DIAGNOSIS — Z79899 Other long term (current) drug therapy: Secondary | ICD-10-CM

## 2017-06-25 DIAGNOSIS — R899 Unspecified abnormal finding in specimens from other organs, systems and tissues: Secondary | ICD-10-CM

## 2017-06-25 DIAGNOSIS — G35 Multiple sclerosis: Secondary | ICD-10-CM

## 2017-06-25 NOTE — Telephone Encounter (Signed)
Spoke with Margaret Henderson and reminded her it is time to repeat CBC with diff.  She verbalized understanding of same, will come in this wk.  Lab order in Epic/fim

## 2017-06-25 NOTE — Telephone Encounter (Signed)
-----   Message from Richard A Sater, MD sent at 05/07/2017  3:07 PM EST ----- Please let her know that the lymphocyte count was mildly low. We need to recheck this in 6-8 weeks and if it continues to be low we might need to consider a different disease modifying therapy (now on Tecfidera) 

## 2017-06-26 ENCOUNTER — Other Ambulatory Visit (INDEPENDENT_AMBULATORY_CARE_PROVIDER_SITE_OTHER): Payer: Medicare Other

## 2017-06-26 DIAGNOSIS — R899 Unspecified abnormal finding in specimens from other organs, systems and tissues: Secondary | ICD-10-CM

## 2017-06-26 DIAGNOSIS — Z0289 Encounter for other administrative examinations: Secondary | ICD-10-CM

## 2017-06-26 DIAGNOSIS — G35 Multiple sclerosis: Secondary | ICD-10-CM

## 2017-06-26 DIAGNOSIS — Z79899 Other long term (current) drug therapy: Secondary | ICD-10-CM

## 2017-06-27 ENCOUNTER — Telehealth: Payer: Self-pay | Admitting: *Deleted

## 2017-06-27 LAB — CBC WITH DIFFERENTIAL/PLATELET
BASOS ABS: 0 10*3/uL (ref 0.0–0.2)
Basos: 1 %
EOS (ABSOLUTE): 0.1 10*3/uL (ref 0.0–0.4)
Eos: 2 %
Hematocrit: 36.7 % (ref 34.0–46.6)
Hemoglobin: 11.8 g/dL (ref 11.1–15.9)
Immature Grans (Abs): 0 10*3/uL (ref 0.0–0.1)
Immature Granulocytes: 0 %
LYMPHS ABS: 0.3 10*3/uL — AB (ref 0.7–3.1)
Lymphs: 11 %
MCH: 24.1 pg — AB (ref 26.6–33.0)
MCHC: 32.2 g/dL (ref 31.5–35.7)
MCV: 75 fL — AB (ref 79–97)
MONOCYTES: 18 %
MONOS ABS: 0.5 10*3/uL (ref 0.1–0.9)
NRBC: 6 % — AB (ref 0–0)
Neutrophils Absolute: 2 10*3/uL (ref 1.4–7.0)
Neutrophils: 68 %
Platelets: 326 10*3/uL (ref 150–379)
RBC: 4.89 x10E6/uL (ref 3.77–5.28)
RDW: 17.4 % — ABNORMAL HIGH (ref 12.3–15.4)
WBC: 2.9 10*3/uL — AB (ref 3.4–10.8)

## 2017-06-27 NOTE — Telephone Encounter (Signed)
-----   Message from Asa Lente, MD sent at 06/27/2017 10:45 AM EDT ----- Please let her know that the lymphocyte count is too low and she needs to stop the Tecfidera.  I would like to see her in the office sometime the next couple weeks to discuss other options.

## 2017-06-27 NOTE — Telephone Encounter (Signed)
I spoke with Margaret Henderson this morning and reviewed below lab results; advised she stop Tecfidera now.  Appt. given on 4/15 at 9am  to discuss other tx. options.  She verbalized understanding of same and is agreeable/fim

## 2017-06-29 ENCOUNTER — Other Ambulatory Visit: Payer: Self-pay

## 2017-07-02 ENCOUNTER — Encounter: Payer: Self-pay | Admitting: Neurology

## 2017-07-02 ENCOUNTER — Ambulatory Visit: Payer: Medicare Other | Admitting: Neurology

## 2017-07-02 VITALS — BP 144/80 | HR 74 | Ht 70.0 in | Wt 216.5 lb

## 2017-07-02 DIAGNOSIS — G373 Acute transverse myelitis in demyelinating disease of central nervous system: Secondary | ICD-10-CM | POA: Diagnosis not present

## 2017-07-02 DIAGNOSIS — D72810 Lymphocytopenia: Secondary | ICD-10-CM | POA: Diagnosis not present

## 2017-07-02 DIAGNOSIS — Z79899 Other long term (current) drug therapy: Secondary | ICD-10-CM | POA: Diagnosis not present

## 2017-07-02 DIAGNOSIS — G35 Multiple sclerosis: Secondary | ICD-10-CM

## 2017-07-02 NOTE — Progress Notes (Signed)
[facility]  PATIENT: CASIE STURGEON DOB: Jul 10, 1965  REFERRING DOCTOR OR PCP:  Dr. Lonie Peak [facility]), Neuro:  Lincoln Brigham SOURCE: paitent and records form [facility]  _________________________________   HISTORICAL  CHIEF COMPLAINT:  Chief Complaint  Patient presents with  . Multiple Sclerosis    Patient here to discuss different treatment options. Lymphocytes fell too low on Tecfidera.     HISTORY OF PRESENT ILLNESS:  Leonor Darnell is a 52 yo woman with RRMS.     Update 07/02/2017: She was on Tecfidera for MS.   Her lymphocyte count was low at 0.5 on 05/04/17 CBC and repeat value was even lower at 0.3 on 06/26/2017.   Therefore, I asked her to come back in to discuss other treatment options as her risk of PML may be higher with prolonged low lymphocyte counts on Tecfidera.  Before Tecfidera, her only other medication was Betaseron but she broke through.      We went over several different options.  She was most interested in staying on a pill.  As her lymphocytes are low, I would prefer her to switch to Aubagio rather than Gilenya, siponimod or Cladribine.  She has given some thought to pregnancy (she is in a second marriage) and we discuss that if she does get her tubal ligation reversed that around that time we would get her off of Aubagio with a washout protocol.    She denies any new MS symptoms since her last visit.  She walks on a regular basis and tries to exercise daily.  She denies any numbness or weakness.   Update 05/04/2017: She feels her MS is doing well. Specifically she has not had any exacerbations or new impairments. She is on Tecfidera and tolerates it well. She has had some borderline lymphocyte readings understands that if she gets a couple of low readings in a row we might consider a different disease modifying therapy.  She denies any significant impairments. Specifically there is no difficulty with her gait and she can easily walk  a mile she had 2. There is no numbness or weakness. Bladder function is fine. Vision is fine. She has a little bit of fatigue now and then but no more than people she knows. It does not interfere with getting tasks done. She denies any depression. She sleeps well. Cognition is fine.  From 08/25/16: MS/transverse myelitis:   In 2016, an MRI of the brain, low she was on Betaseron, showed some new lesions. Therefore, therapy was changed and she started [place] and 2016. She is tolerating it well. She started Tecfidera in 2016 and is tolerating it well.    She never had any GI symptoms. Flushing was mild. She has not had any exacerbations.    She is active and goes to the gym daily and walks 20 miles/week.    An MRI of the brain 09/15/15 showed no lesions while on Tecfidera.     Gait/strength/sensation: Gait is doing well and she has no difficulty with balance. She has no difficulty climbing stairs.   She can climb ladders.    There is no numbness or weakness in the arms or legs. The numbness that occurred with transverse myelitis resolved. There is no problems with coordination.     Bladder: Bladder and bowel function is fine. She denies nocturia.   No constipation.    Vision: She has not had any MS related difficulties with her vision. There is no history of optic neuritis or  diplopia. No problems with color vision  Mood/cognition:  Mood is fine. She denies any depression or anxiety. There is no significant cognitive dysfunction.    Fatigue/sleep:   She denies any significant problem with fatigue. She stays active and walks or exercises daily.   She usually does not have heat intolerance.   She feels that she sleeps well at night.  She is getting 6-7 hours of sleep.  Vitamin D deficiency. She continues on supplementation.   She takes 4000 U/day  MS History:  She was diagnosed with multiple sclerosis in 2002 after presenting with numbness below her waist and clumsiness in the arms and legs. At that  time, she was found to have an MRI consistent with multiple sclerosis. She also underwent a lumbar puncture and CSF was  consistent with multiple sclerosis. She was started on Betaseron and has continued on Betaseron. Last year, an MRI of the brain showed 2 new foci not present in her earlier MRI. She had MRI's mid 2016 at [facility]. . The brain and cervical spine were unchanged but her thoracic spine showed a new focus compared to 2015.   Other:   She has thalassemia and needs to take iron.    Last HgB was 11.8.     REVIEW OF SYSTEMS: Constitutional: No fevers, chills, sweats, or change in appetite.   Rare fatigue Eyes: No visual changes, double vision, eye pain Ear, nose and throat: No hearing loss, ear pain, nasal congestion, sore throat Cardiovascular: No chest pain, palpitations Respiratory: No shortness of breath at rest or with exertion.   No wheezes GastrointestinaI: No nausea, vomiting, diarrhea, abdominal pain, fecal incontinence Genitourinary: No dysuria, urinary retention or frequency.  No nocturia. Musculoskeletal: No neck pain, back pain Integumentary: No rash, pruritus, skin lesions Neurological: as above Psychiatric: No depression at this time.  No anxiety Endocrine: No palpitations, diaphoresis, change in appetite, change in weigh or increased thirst Hematologic/Lymphatic: No anemia, purpura, petechiae. Allergic/Immunologic: No itchy/runny eyes, nasal congestion, recent allergic reactions, rashes  ALLERGIES: No Known Allergies  HOME MEDICATIONS:  Current Outpatient Medications:  .  amLODipine (NORVASC) 5 MG tablet, Take 5 mg by mouth daily., Disp: , Rfl: 4 .  Biotin 1000 MCG tablet, Take 1,000 mcg by mouth 3 (three) times daily., Disp: , Rfl:  .  cholecalciferol (VITAMIN D) 1000 UNITS tablet, Take 2,000 Units by mouth 2 (two) times daily., Disp: , Rfl:  .  ferrous sulfate 325 (65 FE) MG tablet, Take 325 mg by mouth daily with breakfast., Disp: , Rfl:   PAST MEDICAL  HISTORY: Past Medical History:  Diagnosis Date  . Hypertension   . Multiple sclerosis (HCC)   . Vision abnormalities     PAST SURGICAL HISTORY: Past Surgical History:  Procedure Laterality Date  . TUBAL LIGATION  1994    FAMILY HISTORY: Family History  Problem Relation Age of Onset  . Anemia Mother   . Diabetes Mother   . Hypertension Mother     SOCIAL HISTORY:  Social History   Socioeconomic History  . Marital status: Single    Spouse name: Not on file  . Number of children: 2  . Years of education: Not on file  . Highest education level: Not on file  Occupational History  . Occupation: Medical records  Social Needs  . Financial resource strain: Not on file  . Food insecurity:    Worry: Not on file    Inability: Not on file  . Transportation needs:  Medical: Not on file    Non-medical: Not on file  Tobacco Use  . Smoking status: Never Smoker  . Smokeless tobacco: Never Used  Substance and Sexual Activity  . Alcohol use: No    Alcohol/week: 0.0 oz  . Drug use: No  . Sexual activity: Not on file  Lifestyle  . Physical activity:    Days per week: Not on file    Minutes per session: Not on file  . Stress: Not on file  Relationships  . Social connections:    Talks on phone: Not on file    Gets together: Not on file    Attends religious service: Not on file    Active member of club or organization: Not on file    Attends meetings of clubs or organizations: Not on file    Relationship status: Not on file  . Intimate partner violence:    Fear of current or ex partner: Not on file    Emotionally abused: Not on file    Physically abused: Not on file    Forced sexual activity: Not on file  Other Topics Concern  . Not on file  Social History Narrative  . Not on file     PHYSICAL EXAM  Vitals:   07/02/17 0840  BP: (!) 144/80  Pulse: 74  Weight: 216 lb 8 oz (98.2 kg)  Height: 5\' 10"  (1.778 m)    Body mass index is 31.06 kg/m.   General:  The patient is well-developed and well-nourished and in no acute distress   Neurologic Exam  Mental status: The patient is alert and oriented x 3 at the time of the examination. The patient has apparent normal recent and remote memory, with an apparently normal attention span and concentration ability.   Speech is normal.  Cranial nerves: Extraocular movements are full.  Facial strength and sensation is normal.. Trapezius strength is normal.  The tongue is midline, and the patient has symmetric elevation of the soft palate. No obvious hearing deficits are noted.  Motor:  Muscle bulk is normal.   Muscle tone is normal. Strength is 5/5 in the arms and legs..   Sensory: Sensory testing is intact to touch and vibration sensation in all 4 extremities.  Coordination: Finger-nose-finger is normal in both arms.  Gait and station: Station is normal.   Gait is normal.  Tandem gait is mildly wide.  Romberg is negative.  Reflexes: Deep tendon reflexes are symmetric and normal bilaterally.      DIAGNOSTIC DATA (LABS, IMAGING, TESTING) - I reviewed patient records, labs, notes, testing and imaging myself where available.     ASSESSMENT AND PLAN  Multiple sclerosis (HCC) - Plan: QuantiFERON-TB Gold Plus, CBC with Differential/Platelet, Hepatic function panel  Transverse myelitis (HCC)  High risk medication use - Plan: QuantiFERON-TB Gold Plus, CBC with Differential/Platelet, Hepatic function panel  Lymphocytopenia  1.   Her lymphocyte count has steadily declined while on Tecfidera and she discontinued it last week.  We went over some options.  She was switched to Aubagio.  We will check some blood work today.  I would want her lymphocyte count to be above 0.5 before starting Aubagio so we will repeat this in 3-4 weeks if needed.   2.  Continue vitamin D supplements at 4000 units daily. 3.   Return to clinic 6 months or sooner if there are new or worsening neurologic symptoms.   Nikolos Billig A.  Epimenio Foot, MD, PhD, [place]    07/02/17  10:04 AM Certified in Neurology, Clinical Neurophysiology, Sleep Medicine, Pain Medicine and Neuroimaging Director, [facility] Sclerosis Center at [facility]  [facility] [address], [address] [place], [place] [address] [phone]    Orders Placed This Encounter  Procedures  . QuantiFERON-TB Gold Plus  . CBC with Differential/Platelet  . Hepatic function panel

## 2017-07-06 LAB — HEPATIC FUNCTION PANEL
ALBUMIN: 4.6 g/dL (ref 3.5–5.5)
ALK PHOS: 73 IU/L (ref 39–117)
ALT: 13 IU/L (ref 0–32)
AST: 15 IU/L (ref 0–40)
BILIRUBIN TOTAL: 0.3 mg/dL (ref 0.0–1.2)
Bilirubin, Direct: 0.08 mg/dL (ref 0.00–0.40)
TOTAL PROTEIN: 7.6 g/dL (ref 6.0–8.5)

## 2017-07-06 LAB — QUANTIFERON-TB GOLD PLUS
QUANTIFERON TB1 AG VALUE: 0.02 [IU]/mL
QUANTIFERON TB2 AG VALUE: 0.02 [IU]/mL
QuantiFERON Mitogen Value: 0.28 IU/mL
QuantiFERON Nil Value: 0.02 IU/mL
QuantiFERON-TB Gold Plus: UNDETERMINED — AB

## 2017-07-06 LAB — CBC WITH DIFFERENTIAL/PLATELET
BASOS ABS: 0 10*3/uL (ref 0.0–0.2)
Basos: 0 %
EOS (ABSOLUTE): 0.1 10*3/uL (ref 0.0–0.4)
Eos: 2 %
HEMOGLOBIN: 12.6 g/dL (ref 11.1–15.9)
Hematocrit: 39.6 % (ref 34.0–46.6)
IMMATURE GRANULOCYTES: 0 %
Immature Grans (Abs): 0 10*3/uL (ref 0.0–0.1)
LYMPHS ABS: 0.5 10*3/uL — AB (ref 0.7–3.1)
Lymphs: 18 %
MCH: 23.5 pg — ABNORMAL LOW (ref 26.6–33.0)
MCHC: 31.8 g/dL (ref 31.5–35.7)
MCV: 74 fL — ABNORMAL LOW (ref 79–97)
Monocytes Absolute: 0.2 10*3/uL (ref 0.1–0.9)
Monocytes: 9 %
NEUTROS PCT: 71 %
Neutrophils Absolute: 1.9 10*3/uL (ref 1.4–7.0)
Platelets: 329 10*3/uL (ref 150–379)
RBC: 5.37 x10E6/uL — AB (ref 3.77–5.28)
RDW: 16.6 % — AB (ref 12.3–15.4)
WBC: 2.8 10*3/uL — AB (ref 3.4–10.8)

## 2017-07-09 ENCOUNTER — Telehealth: Payer: Self-pay | Admitting: *Deleted

## 2017-07-09 DIAGNOSIS — G35 Multiple sclerosis: Secondary | ICD-10-CM

## 2017-07-09 DIAGNOSIS — Z79899 Other long term (current) drug therapy: Secondary | ICD-10-CM

## 2017-07-09 DIAGNOSIS — R899 Unspecified abnormal finding in specimens from other organs, systems and tissues: Secondary | ICD-10-CM

## 2017-07-09 NOTE — Telephone Encounter (Signed)
Spoke with Margaret Henderson and reviewed below TB test and need for CXR to r/o TB exposure.  She is agreeable and will have CXR asap/fim

## 2017-07-09 NOTE — Addendum Note (Signed)
Addended by: Candis Schatz I on: 07/09/2017 09:58 AM   Modules accepted: Orders

## 2017-07-09 NOTE — Telephone Encounter (Signed)
-----   Message from Asa Lente, MD sent at 07/07/2017  1:19 PM EDT ----- Please let her know that the lymphocyte counts were better (0.5 compared to 0.3) still a little bit low (0.8 and up is normal)   the TB test was "indeterminate".  Therefore, we need to check an x-ray of the chest with 2 views (PA and LAT)

## 2017-07-12 ENCOUNTER — Ambulatory Visit
Admission: RE | Admit: 2017-07-12 | Discharge: 2017-07-12 | Disposition: A | Discharge status: Home / Self Care | Payer: Medicare Other | Source: Ambulatory Visit | Attending: Neurology | Admitting: Neurology

## 2017-07-12 DIAGNOSIS — Z79899 Other long term (current) drug therapy: Secondary | ICD-10-CM

## 2017-07-12 DIAGNOSIS — R899 Unspecified abnormal finding in specimens from other organs, systems and tissues: Secondary | ICD-10-CM

## 2017-07-12 DIAGNOSIS — G35 Multiple sclerosis: Secondary | ICD-10-CM

## 2017-07-13 ENCOUNTER — Telehealth: Payer: Self-pay | Admitting: *Deleted

## 2017-07-13 NOTE — Telephone Encounter (Signed)
Spoke with Margaret Henderson and per RAS, explained CXR is ok--she can start Aubagio.  SRF has been faxed to [employer].  She verbalized understanding of same and is agreeable.  Per her request, TB test and CXR results have been faxed to her employer for her files there/fim

## 2017-07-17 ENCOUNTER — Telehealth: Payer: Self-pay | Admitting: *Deleted

## 2017-07-17 NOTE — Telephone Encounter (Signed)
Ref# for call to [facility]: [id number]

## 2017-07-17 NOTE — Telephone Encounter (Signed)
Per [facility] (phone# [phone]), No PA needed for Aubagio at this time.  It is on pt's list of covered drugs. I faxed this information to [employer]/fim

## 2017-10-01 ENCOUNTER — Telehealth: Payer: Self-pay | Admitting: *Deleted

## 2017-10-01 MED ORDER — TERIFLUNOMIDE 14 MG PO TABS: 14.0000 mg | ORAL_TABLET | Freq: Every day | ORAL | 3 refills | Status: DC

## 2017-10-01 NOTE — Telephone Encounter (Signed)
Aubagio escribed to [facility] in response to faxed request from them/fim

## 2017-11-02 ENCOUNTER — Encounter: Payer: Self-pay | Admitting: Neurology

## 2017-11-02 ENCOUNTER — Other Ambulatory Visit: Payer: Self-pay

## 2017-11-02 ENCOUNTER — Ambulatory Visit: Payer: Medicare Other | Admitting: Neurology

## 2017-11-02 VITALS — BP 152/85 | HR 72 | Resp 16 | Ht 70.0 in | Wt 210.0 lb

## 2017-11-02 DIAGNOSIS — R269 Unspecified abnormalities of gait and mobility: Secondary | ICD-10-CM

## 2017-11-02 DIAGNOSIS — Z79899 Other long term (current) drug therapy: Secondary | ICD-10-CM

## 2017-11-02 DIAGNOSIS — G35 Multiple sclerosis: Secondary | ICD-10-CM

## 2017-11-02 DIAGNOSIS — D72810 Lymphocytopenia: Secondary | ICD-10-CM

## 2017-11-02 NOTE — Progress Notes (Signed)
[facility]  PATIENT: Margaret Henderson DOB: 28-Jul-1965  REFERRING DOCTOR OR PCP:  Dr. Lonie Peak [facility]), Neuro:  Margaret Henderson SOURCE: paitent and records form [facility]  _________________________________   HISTORICAL  CHIEF COMPLAINT:  Chief Complaint  Patient presents with  . Multiple Sclerosis    Sts. she is tolerating Aubagio with occ. gi upset, which is usually manageable by eating with med dose/fim    HISTORY OF PRESENT ILLNESS:  Margaret Henderson is a 52 yo woman with RRMS.     Update 11/02/2017: She is tolerating Aubagio well.   She switched from Tecfidera due to low lymphocyte counts (0.5).   No exacerbations or new symptoms   last MRI was March 2019 and did not show any new lesions (she was on Tecfidera at the time).  Gait, strength and sensation are fine.  No spasticity or dysesthesia.   She has some eye twitching.    She denies change in vision. She has mild urinary frequency.     She reports only mild fatigue.  She tries to stay out of the strong heat and does well most days.   She is sleeping well.      Mood and cognition are fine.  Update 07/02/2017: She was on Tecfidera for MS.   Her lymphocyte count was low at 0.5 on 05/04/17 CBC and repeat value was even lower at 0.3 on 06/26/2017.   Therefore, I asked her to come back in to discuss other treatment options as her risk of PML may be higher with prolonged low lymphocyte counts on Tecfidera.  Before Tecfidera, her only other medication was Betaseron but she broke through.      We went over several different options.  She was most interested in staying on a pill.  As her lymphocytes are low, I would prefer her to switch to Aubagio rather than Gilenya, siponimod or Cladribine.  She has given some thought to pregnancy (she is in a second marriage) and we discuss that if she does get her tubal ligation reversed that around that time we would get her off of Aubagio with a washout protocol.    She  denies any new MS symptoms since her last visit.  She walks on a regular basis and tries to exercise daily.  She denies any numbness or weakness.   Update 05/04/2017: She feels her MS is doing well. Specifically she has not had any exacerbations or new impairments. She is on Tecfidera and tolerates it well. She has had some borderline lymphocyte readings understands that if she gets a couple of low readings in a row we might consider a different disease modifying therapy.  She denies any significant impairments. Specifically there is no difficulty with her gait and she can easily walk a mile she had 2. There is no numbness or weakness. Bladder function is fine. Vision is fine. She has a little bit of fatigue now and then but no more than people she knows. It does not interfere with getting tasks done. She denies any depression. She sleeps well. Cognition is fine.  From 08/25/16: MS/transverse myelitis:   In 2016, an MRI of the brain, low she was on Betaseron, showed some new lesions. Therefore, therapy was changed and she started [place] and 2016. She is tolerating it well. She started Tecfidera in 2016 and is tolerating it well.    She never had any GI symptoms. Flushing was mild. She has not had any exacerbations.    She  is active and goes to the gym daily and walks 20 miles/week.    An MRI of the brain 09/15/15 showed no lesions while on Tecfidera.     Gait/strength/sensation: Gait is doing well and she has no difficulty with balance. She has no difficulty climbing stairs.   She can climb ladders.    There is no numbness or weakness in the arms or legs. The numbness that occurred with transverse myelitis resolved. There is no problems with coordination.     Bladder: Bladder and bowel function is fine. She denies nocturia.   No constipation.    Vision: She has not had any MS related difficulties with her vision. There is no history of optic neuritis or diplopia. No problems with color  vision  Mood/cognition:  Mood is fine. She denies any depression or anxiety. There is no significant cognitive dysfunction.    Fatigue/sleep:   She denies any significant problem with fatigue. She stays active and walks or exercises daily.   She usually does not have heat intolerance.   She feels that she sleeps well at night.  She is getting 6-7 hours of sleep.  Vitamin D deficiency. She continues on supplementation.   She takes 4000 U/day  MS History:  She was diagnosed with multiple sclerosis in 2002 after presenting with numbness below her waist and clumsiness in the arms and legs. At that time, she was found to have an MRI consistent with multiple sclerosis. She also underwent a lumbar puncture and CSF was  consistent with multiple sclerosis. She was started on Betaseron and has continued on Betaseron. Last year, an MRI of the brain showed 2 new foci not present in her earlier MRI. She had MRI's mid 2016 at [facility]. . The brain and cervical spine were unchanged but her thoracic spine showed a new focus compared to 2015.   Other:   She has thalassemia and needs to take iron.    Last HgB was 11.8.     REVIEW OF SYSTEMS: Constitutional: No fevers, chills, sweats, or change in appetite.   Rare fatigue Eyes: No visual changes, double vision, eye pain Ear, nose and throat: No hearing loss, ear pain, nasal congestion, sore throat Cardiovascular: No chest pain, palpitations Respiratory: No shortness of breath at rest or with exertion.   No wheezes GastrointestinaI: No nausea, vomiting, diarrhea, abdominal pain, fecal incontinence Genitourinary: No dysuria, urinary retention or frequency.  No nocturia. Musculoskeletal: No neck pain, back pain Integumentary: No rash, pruritus, skin lesions Neurological: as above Psychiatric: No depression at this time.  No anxiety Endocrine: No palpitations, diaphoresis, change in appetite, change in weigh or increased thirst Hematologic/Lymphatic: No  anemia, purpura, petechiae. Allergic/Immunologic: No itchy/runny eyes, nasal congestion, recent allergic reactions, rashes  ALLERGIES: No Known Allergies  HOME MEDICATIONS:  Current Outpatient Medications:  .  amLODipine (NORVASC) 5 MG tablet, Take 5 mg by mouth daily., Disp: , Rfl: 4 .  Biotin 1000 MCG tablet, Take 1,000 mcg by mouth 3 (three) times daily., Disp: , Rfl:  .  cholecalciferol (VITAMIN D) 1000 UNITS tablet, Take 2,000 Units by mouth 2 (two) times daily., Disp: , Rfl:  .  Collagen 500 MG CAPS, Take by mouth., Disp: , Rfl:  .  ferrous sulfate 325 (65 FE) MG tablet, Take 325 mg by mouth daily with breakfast., Disp: , Rfl:  .  Teriflunomide (AUBAGIO) 14 MG TABS, Take 14 mg by mouth daily., Disp: 90 tablet, Rfl: 3  PAST MEDICAL HISTORY: Past Medical History:  Diagnosis Date  . Hypertension   . Multiple sclerosis (HCC)   . Vision abnormalities     PAST SURGICAL HISTORY: Past Surgical History:  Procedure Laterality Date  . TUBAL LIGATION  1994    FAMILY HISTORY: Family History  Problem Relation Age of Onset  . Anemia Mother   . Diabetes Mother   . Hypertension Mother     SOCIAL HISTORY:  Social History   Socioeconomic History  . Marital status: Single    Spouse name: Not on file  . Number of children: 2  . Years of education: Not on file  . Highest education level: Not on file  Occupational History  . Occupation: Medical records  Social Needs  . Financial resource strain: Not on file  . Food insecurity:    Worry: Not on file    Inability: Not on file  . Transportation needs:    Medical: Not on file    Non-medical: Not on file  Tobacco Use  . Smoking status: Never Smoker  . Smokeless tobacco: Never Used  Substance and Sexual Activity  . Alcohol use: No    Alcohol/week: 0.0 standard drinks  . Drug use: No  . Sexual activity: Not on file  Lifestyle  . Physical activity:    Days per week: Not on file    Minutes per session: Not on file  .  Stress: Not on file  Relationships  . Social connections:    Talks on phone: Not on file    Gets together: Not on file    Attends religious service: Not on file    Active member of club or organization: Not on file    Attends meetings of clubs or organizations: Not on file    Relationship status: Not on file  . Intimate partner violence:    Fear of current or ex partner: Not on file    Emotionally abused: Not on file    Physically abused: Not on file    Forced sexual activity: Not on file  Other Topics Concern  . Not on file  Social History Narrative  . Not on file     PHYSICAL EXAM  Vitals:   11/02/17 0827  BP: (!) 152/85  Pulse: 72  Resp: 16  Weight: 210 lb (95.3 kg)  Height: 5\' 10"  (1.778 m)    Body mass index is 30.13 kg/m.   General: The patient is well-developed and well-nourished and in no acute distress   Neurologic Exam  Mental status: The patient is alert and oriented x 3 at the time of the examination. The patient has apparent normal recent and remote memory, with an apparently normal attention span and concentration ability.   Speech is normal.  Cranial nerves: Extraocular movements are full.  Facial strength and sensation was normal.  Trapezius strength is strong.. No obvious hearing deficits are noted.  Motor:  Muscle bulk is normal.   Muscle tone is normal. Strength is 5/5 in the arms and legs..   Sensory: Sensory testing showed normal sensation to touch temperature and vibration.  Coordination: Finger-nose-finger is normal in both arms.  Heel-to-shin was good.  Gait and station: Station is normal.   Gait is normal.  The tandem gait is mildly wide.  Romberg is negative.  Reflexes: Deep tendon reflexes are symmetric and normal bilaterally.      DIAGNOSTIC DATA (LABS, IMAGING, TESTING) - I reviewed patient records, labs, notes, testing and imaging myself where available.     ASSESSMENT AND PLAN  Multiple sclerosis (HCC)  High risk  medication use  Lymphocytopenia  Gait disturbance  1.   Continue Aubagio.  We will check CBC with differential and function panel today to assess for leukopenia and hepatotoxicity. 2.  Continue vitamin D supplements at 4000-5000 units daily. 3.   Return to clinic 6 months or sooner if there are new or worsening neurologic symptoms.   Richard A. Epimenio Foot, MD, PhD, [place]    11/02/17   8:44 AM Certified in Neurology, Clinical Neurophysiology, Sleep Medicine, Pain Medicine and Neuroimaging Director, Multiple Sclerosis Center at [facility]  [facility] [address], [address] [place], [place] [address] [phone]

## 2017-11-03 LAB — CBC WITH DIFFERENTIAL/PLATELET
Basophils Absolute: 0 10*3/uL (ref 0.0–0.2)
Basos: 0 %
EOS (ABSOLUTE): 0.1 10*3/uL (ref 0.0–0.4)
Eos: 4 %
HEMOGLOBIN: 12.1 g/dL (ref 11.1–15.9)
Hematocrit: 37.1 % (ref 34.0–46.6)
IMMATURE GRANS (ABS): 0 10*3/uL (ref 0.0–0.1)
IMMATURE GRANULOCYTES: 0 %
LYMPHS: 13 %
Lymphocytes Absolute: 0.4 10*3/uL — ABNORMAL LOW (ref 0.7–3.1)
MCH: 24.1 pg — ABNORMAL LOW (ref 26.6–33.0)
MCHC: 32.6 g/dL (ref 31.5–35.7)
MCV: 74 fL — ABNORMAL LOW (ref 79–97)
MONOCYTES: 17 %
Monocytes Absolute: 0.6 10*3/uL (ref 0.1–0.9)
NEUTROS PCT: 66 %
Neutrophils Absolute: 2.2 10*3/uL (ref 1.4–7.0)
PLATELETS: 266 10*3/uL (ref 150–450)
RBC: 5.02 x10E6/uL (ref 3.77–5.28)
RDW: 17.6 % — ABNORMAL HIGH (ref 12.3–15.4)
WBC: 3.3 10*3/uL — AB (ref 3.4–10.8)

## 2017-11-03 LAB — HEPATIC FUNCTION PANEL
ALBUMIN: 4 g/dL (ref 3.5–5.5)
ALK PHOS: 63 IU/L (ref 39–117)
ALT: 14 IU/L (ref 0–32)
AST: 17 IU/L (ref 0–40)
BILIRUBIN TOTAL: 0.3 mg/dL (ref 0.0–1.2)
BILIRUBIN, DIRECT: 0.07 mg/dL (ref 0.00–0.40)
TOTAL PROTEIN: 7 g/dL (ref 6.0–8.5)

## 2017-11-05 ENCOUNTER — Other Ambulatory Visit: Payer: Self-pay | Admitting: *Deleted

## 2017-11-05 ENCOUNTER — Encounter: Payer: Self-pay | Admitting: *Deleted

## 2017-11-05 ENCOUNTER — Telehealth: Payer: Self-pay | Admitting: *Deleted

## 2017-11-05 DIAGNOSIS — G35 Multiple sclerosis: Secondary | ICD-10-CM

## 2017-11-05 DIAGNOSIS — R7989 Other specified abnormal findings of blood chemistry: Secondary | ICD-10-CM

## 2017-11-05 DIAGNOSIS — Z79899 Other long term (current) drug therapy: Secondary | ICD-10-CM

## 2017-11-05 NOTE — Progress Notes (Signed)
Recheck CBC due to low lymphoctes/fim

## 2017-11-05 NOTE — Telephone Encounter (Signed)
-----   Message from Asa Lente, MD sent at 11/04/2017  6:17 PM EDT ----- Please let her know that the lymphocyte count is still low.  She needs to change the Aubagio to every other day.  We should check again in 6 to 8 weeks.

## 2017-11-05 NOTE — Telephone Encounter (Signed)
Spoke with Margaret Henderson and reviewed below lab results. and advised she should decrease Aubagio to QOD and repeat CBC in 6-8 wks.  She verbalized understanding of same, went ahead and made appt. for repeat CBC on 12/28/17 at 8am/fim

## 2017-12-28 ENCOUNTER — Other Ambulatory Visit (INDEPENDENT_AMBULATORY_CARE_PROVIDER_SITE_OTHER): Payer: Medicare Other

## 2017-12-28 DIAGNOSIS — R7989 Other specified abnormal findings of blood chemistry: Secondary | ICD-10-CM

## 2017-12-28 DIAGNOSIS — Z0289 Encounter for other administrative examinations: Secondary | ICD-10-CM

## 2017-12-28 DIAGNOSIS — Z79899 Other long term (current) drug therapy: Secondary | ICD-10-CM

## 2017-12-28 DIAGNOSIS — G35 Multiple sclerosis: Secondary | ICD-10-CM

## 2017-12-31 LAB — CBC WITH DIFFERENTIAL/PLATELET
BASOS ABS: 0 10*3/uL (ref 0.0–0.2)
Basos: 0 %
EOS (ABSOLUTE): 0.2 10*3/uL (ref 0.0–0.4)
Eos: 5 %
Hematocrit: 36.9 % (ref 34.0–46.6)
Hemoglobin: 11.6 g/dL (ref 11.1–15.9)
IMMATURE GRANS (ABS): 0 10*3/uL (ref 0.0–0.1)
IMMATURE GRANULOCYTES: 0 %
LYMPHS: 17 %
Lymphocytes Absolute: 0.7 10*3/uL (ref 0.7–3.1)
MCH: 23 pg — ABNORMAL LOW (ref 26.6–33.0)
MCHC: 31.4 g/dL — ABNORMAL LOW (ref 31.5–35.7)
MCV: 73 fL — ABNORMAL LOW (ref 79–97)
MONOCYTES: 14 %
Monocytes Absolute: 0.5 10*3/uL (ref 0.1–0.9)
NEUTROS PCT: 64 %
Neutrophils Absolute: 2.4 10*3/uL (ref 1.4–7.0)
PLATELETS: 301 10*3/uL (ref 150–450)
RBC: 5.05 x10E6/uL (ref 3.77–5.28)
RDW: 17.4 % — AB (ref 12.3–15.4)
WBC: 3.7 10*3/uL (ref 3.4–10.8)

## 2018-03-18 ENCOUNTER — Telehealth: Payer: Self-pay | Admitting: *Deleted

## 2018-03-18 NOTE — Telephone Encounter (Signed)
Called pt to let her know to contact [facility] that is trying to reach out to her to schedule shipment for Aubagio. She states she spoke with them last week and since she takes medication every other day she does not need refill yet. She stated she never heard back about lab results from 12/28/17 and assumed things were ok. She had PCP recheck and those were fine afterwards. She is going to continue Aubagio every other day unless she hears otherwise. Advised I will send message to Dr. Epimenio Foot to review in case he wants her to change treatment in any way. She verbalized understanding.

## 2018-03-22 NOTE — Telephone Encounter (Signed)
Labs in October looked good.   She can continue every other day

## 2018-03-22 NOTE — Telephone Encounter (Signed)
Noted  

## 2018-05-09 ENCOUNTER — Telehealth: Payer: Self-pay | Admitting: *Deleted

## 2018-05-09 NOTE — Telephone Encounter (Signed)
Called and spoke with pt. Advised appt tomorrow will need to be r/s since office closed d/t inclement weather. Rescheduled to 05/21/18 at 9am with Dr. Epimenio Foot.

## 2018-05-10 ENCOUNTER — Ambulatory Visit: Payer: Medicare Other | Admitting: Neurology

## 2018-05-21 ENCOUNTER — Encounter: Payer: Self-pay | Admitting: Neurology

## 2018-05-21 ENCOUNTER — Ambulatory Visit (INDEPENDENT_AMBULATORY_CARE_PROVIDER_SITE_OTHER): Payer: Medicare Other | Admitting: Neurology

## 2018-05-21 VITALS — BP 161/90 | HR 79 | Ht 70.0 in | Wt 208.0 lb

## 2018-05-21 DIAGNOSIS — Z79899 Other long term (current) drug therapy: Secondary | ICD-10-CM | POA: Diagnosis not present

## 2018-05-21 DIAGNOSIS — R269 Unspecified abnormalities of gait and mobility: Secondary | ICD-10-CM

## 2018-05-21 DIAGNOSIS — G35 Multiple sclerosis: Secondary | ICD-10-CM | POA: Diagnosis not present

## 2018-05-21 DIAGNOSIS — D72810 Lymphocytopenia: Secondary | ICD-10-CM

## 2018-05-21 NOTE — Progress Notes (Signed)
[facility]  PATIENT: Margaret Henderson DOB: 1966/01/01  REFERRING DOCTOR OR PCP:  Dr. Lonie Peak [facility]), Neuro:  Lincoln Brigham SOURCE: paitent and records form [facility]  _________________________________   HISTORICAL  CHIEF COMPLAINT:  Chief Complaint  Patient presents with  . Follow-up    RM 13, alone. Last seen 11/02/17. Denies any new sx. Doing well.  . Multiple Sclerosis    On Aubagio, tolerating well. Takes every other day.    HISTORY OF PRESENT ILLNESS:  Margaret Henderson is a 53 y.o. woman with RRMS.     Update 05/21/2018: She is on Aubagio and tolerates it well.   She switched from Tecfidera due to low lymphocyte counts and I had her do qod Aubagio as lymphocytes were low at first.    She has no exacerbations or new symptoms.   She is exercising several times a week and has no problems with walking.  She can do stairs.     She feels vision is fine.   Bladder function is fine.    She denies much fatigue.   Her father passed last week (prostate cancer to bone and liver) but she feels she is holding up well.      She sleeps well some nights but if she wakes up she has trouble falling back asleep.   She works day shifts only.    She does not want any medication.      Update 11/02/2017: She is tolerating Aubagio well.   She switched from Tecfidera due to low lymphocyte counts (0.5).   No exacerbations or new symptoms   last MRI was March 2019 and did not show any new lesions (she was on Tecfidera at the time).  Gait, strength and sensation are fine.  No spasticity or dysesthesia.   She has some eye twitching.    She denies change in vision. She has mild urinary frequency.     She reports only mild fatigue.  She tries to stay out of the strong heat and does well most days.   She is sleeping well.      Mood and cognition are fine.  Update 07/02/2017: She was on Tecfidera for MS.   Her lymphocyte count was low at 0.5 on 05/04/17 CBC and repeat value was even  lower at 0.3 on 06/26/2017.   Therefore, I asked her to come back in to discuss other treatment options as her risk of PML may be higher with prolonged low lymphocyte counts on Tecfidera.  Before Tecfidera, her only other medication was Betaseron but she broke through.      We went over several different options.  She was most interested in staying on a pill.  As her lymphocytes are low, I would prefer her to switch to Aubagio rather than Gilenya, siponimod or Cladribine.  She has given some thought to pregnancy (she is in a second marriage) and we discuss that if she does get her tubal ligation reversed that around that time we would get her off of Aubagio with a washout protocol.    She denies any new MS symptoms since her last visit.  She walks on a regular basis and tries to exercise daily.  She denies any numbness or weakness.   Update 05/04/2017: She feels her MS is doing well. Specifically she has not had any exacerbations or new impairments. She is on Tecfidera and tolerates it well. She has had some borderline lymphocyte readings understands that if she gets a  couple of low readings in a row we might consider a different disease modifying therapy.  She denies any significant impairments. Specifically there is no difficulty with her gait and she can easily walk a mile she had 2. There is no numbness or weakness. Bladder function is fine. Vision is fine. She has a little bit of fatigue now and then but no more than people she knows. It does not interfere with getting tasks done. She denies any depression. She sleeps well. Cognition is fine.  From 08/25/16: MS/transverse myelitis:   In 2016, an MRI of the brain, low she was on Betaseron, showed some new lesions. Therefore, therapy was changed and she started [place] and 2016. She is tolerating it well. She started Tecfidera in 2016 and is tolerating it well.    She never had any GI symptoms. Flushing was mild. She has not had any exacerbations.     She is active and goes to the gym daily and walks 20 miles/week.    An MRI of the brain 09/15/15 showed no lesions while on Tecfidera.     Gait/strength/sensation: Gait is doing well and she has no difficulty with balance. She has no difficulty climbing stairs.   She can climb ladders.    There is no numbness or weakness in the arms or legs. The numbness that occurred with transverse myelitis resolved. There is no problems with coordination.     Bladder: Bladder and bowel function is fine. She denies nocturia.   No constipation.    Vision: She has not had any MS related difficulties with her vision. There is no history of optic neuritis or diplopia. No problems with color vision  Mood/cognition:  Mood is fine. She denies any depression or anxiety. There is no significant cognitive dysfunction.    Fatigue/sleep:   She denies any significant problem with fatigue. She stays active and walks or exercises daily.   She usually does not have heat intolerance.   She feels that she sleeps well at night.  She is getting 6-7 hours of sleep.  Vitamin D deficiency. She continues on supplementation.   She takes 4000 U/day  MS History:  She was diagnosed with multiple sclerosis in 2002 after presenting with numbness below her waist and clumsiness in the arms and legs. At that time, she was found to have an MRI consistent with multiple sclerosis. She also underwent a lumbar puncture and CSF was  consistent with multiple sclerosis. She was started on Betaseron and has continued on Betaseron. Last year, an MRI of the brain showed 2 new foci not present in her earlier MRI. She had MRI's mid 2016 at [place]. . The brain and cervical spine were unchanged but her thoracic spine showed a new focus compared to 2015.   Other:   She has thalassemia and needs to take iron.    Last HgB was 11.8.     REVIEW OF SYSTEMS: Constitutional: No fevers, chills, sweats, or change in appetite.   Rare fatigue Eyes: No visual changes,  double vision, eye pain Ear, nose and throat: No hearing loss, ear pain, nasal congestion, sore throat Cardiovascular: No chest pain, palpitations Respiratory: No shortness of breath at rest or with exertion.   No wheezes GastrointestinaI: No nausea, vomiting, diarrhea, abdominal pain, fecal incontinence Genitourinary: No dysuria, urinary retention or frequency.  No nocturia. Musculoskeletal: No neck pain, back pain Integumentary: No rash, pruritus, skin lesions Neurological: as above Psychiatric: No depression at this time.  No anxiety Endocrine:  No palpitations, diaphoresis, change in appetite, change in weigh or increased thirst Hematologic/Lymphatic: No anemia, purpura, petechiae. Allergic/Immunologic: No itchy/runny eyes, nasal congestion, recent allergic reactions, rashes  ALLERGIES: No Known Allergies  HOME MEDICATIONS:  Current Outpatient Medications:  .  amLODipine (NORVASC) 5 MG tablet, Take 5 mg by mouth daily., Disp: , Rfl: 4 .  Biotin 1000 MCG tablet, Take 1,000 mcg by mouth 3 (three) times daily., Disp: , Rfl:  .  cholecalciferol (VITAMIN D) 1000 UNITS tablet, Take 2,000 Units by mouth 2 (two) times daily., Disp: , Rfl:  .  Collagen 500 MG CAPS, Take by mouth., Disp: , Rfl:  .  ferrous sulfate 325 (65 FE) MG tablet, Take 325 mg by mouth daily with breakfast., Disp: , Rfl:  .  Teriflunomide (AUBAGIO) 14 MG TABS, Take 14 mg by mouth daily. (Patient taking differently: Take 14 mg by mouth every other day. ), Disp: 90 tablet, Rfl: 3  PAST MEDICAL HISTORY: Past Medical History:  Diagnosis Date  . Hypertension   . Multiple sclerosis (HCC)   . Vision abnormalities     PAST SURGICAL HISTORY: Past Surgical History:  Procedure Laterality Date  . TUBAL LIGATION  1994    FAMILY HISTORY: Family History  Problem Relation Age of Onset  . Anemia Mother   . Diabetes Mother   . Hypertension Mother     SOCIAL HISTORY:  Social History   Socioeconomic History  .  Marital status: Single    Spouse name: Not on file  . Number of children: 2  . Years of education: Not on file  . Highest education level: Not on file  Occupational History  . Occupation: Medical records  Social Needs  . Financial resource strain: Not on file  . Food insecurity:    Worry: Not on file    Inability: Not on file  . Transportation needs:    Medical: Not on file    Non-medical: Not on file  Tobacco Use  . Smoking status: Never Smoker  . Smokeless tobacco: Never Used  Substance and Sexual Activity  . Alcohol use: No    Alcohol/week: 0.0 standard drinks  . Drug use: No  . Sexual activity: Not on file  Lifestyle  . Physical activity:    Days per week: Not on file    Minutes per session: Not on file  . Stress: Not on file  Relationships  . Social connections:    Talks on phone: Not on file    Gets together: Not on file    Attends religious service: Not on file    Active member of club or organization: Not on file    Attends meetings of clubs or organizations: Not on file    Relationship status: Not on file  . Intimate partner violence:    Fear of current or ex partner: Not on file    Emotionally abused: Not on file    Physically abused: Not on file    Forced sexual activity: Not on file  Other Topics Concern  . Not on file  Social History Narrative   Right handed    Caffeine use: Tea sometimes.     PHYSICAL EXAM  Vitals:   05/21/18 0852  BP: (!) 161/90  Pulse: 79  SpO2: 99%  Weight: 208 lb (94.3 kg)  Height:  (1.778 m)    Body mass index is 29.84 kg/m.   General: The patient is well-developed and well-nourished and in no acute distress   Neurologic  Exam  Mental status: The patient is alert and oriented x 3 at the time of the examination. The patient has apparent normal recent and remote memory, with an apparently normal attention span and concentration ability.   Speech is normal.  Cranial nerves: Extraocular movements are full.   Color vision was normal and symmetric.  Facial strength and sensation was normal.  Trapezius strength is strong.. No obvious hearing deficits are noted.  Motor:  Muscle bulk is normal.   Muscle tone is normal. Strength is 5/5 in the arms and legs..   Sensory: Sensory testing showed normal sensation to temperature and vibration sensation in the arms and legs  Coordination: Finger-nose-finger is normal in both arms.  Heel-to-shin was good.  Gait and station: Station is normal.   The gait is normal.  Tandem gait is just slightly wide.  Romberg is negative.  Reflexes: Deep tendon reflexes are symmetric and normal bilaterally.      DIAGNOSTIC DATA (LABS, IMAGING, TESTING) - I reviewed patient records, labs, notes, testing and imaging myself where available.     ASSESSMENT AND PLAN  Multiple sclerosis (HCC) - Plan: CBC with Differential/Platelet, Comprehensive metabolic panel  High risk medication use - Plan: CBC with Differential/Platelet, Comprehensive metabolic panel  Lymphocytopenia - Plan: CBC with Differential/Platelet, Comprehensive metabolic panel  Gait disturbance  1.   She will continue Aubagio.  I will check a CBC with differential and CMP.  If the lymphocyte count is still 0.7 or lower or if the LFTs are significantly elevated we will stay at 40 mg every other day dose, otherwise increase to every day. 2.  Continue vitamin D supplements at 4000-5000 units daily. 3.   Stay active and exercise.   Return to clinic 6 months or sooner if there are new or worsening neurologic symptoms.    A. Epimenio Foot, MD, PhD, [place]    05/21/18   9:35 AM Certified in Neurology, Clinical Neurophysiology, Sleep Medicine, Pain Medicine and Neuroimaging Director, [facility]  [facility] [address] [place], [place] [address] [phone]

## 2018-05-22 ENCOUNTER — Telehealth: Payer: Self-pay | Admitting: *Deleted

## 2018-05-22 LAB — COMPREHENSIVE METABOLIC PANEL
ALK PHOS: 77 IU/L (ref 39–117)
ALT: 29 IU/L (ref 0–32)
AST: 21 IU/L (ref 0–40)
Albumin/Globulin Ratio: 1.4 (ref 1.2–2.2)
Albumin: 4.3 g/dL (ref 3.8–4.8)
BILIRUBIN TOTAL: 0.3 mg/dL (ref 0.0–1.2)
BUN/Creatinine Ratio: 10 (ref 9–23)
BUN: 7 mg/dL (ref 6–24)
CHLORIDE: 100 mmol/L (ref 96–106)
CO2: 23 mmol/L (ref 20–29)
Calcium: 10.4 mg/dL — ABNORMAL HIGH (ref 8.7–10.2)
Creatinine, Ser: 0.7 mg/dL (ref 0.57–1.00)
GFR calc Af Amer: 117 mL/min/{1.73_m2} (ref >59)
GFR calc non Af Amer: 101 mL/min/{1.73_m2} (ref >59)
GLUCOSE: 82 mg/dL (ref 65–99)
Globulin, Total: 3.1 g/dL (ref 1.5–4.5)
Potassium: 4.3 mmol/L (ref 3.5–5.2)
Sodium: 137 mmol/L (ref 134–144)
Total Protein: 7.4 g/dL (ref 6.0–8.5)

## 2018-05-22 LAB — CBC WITH DIFFERENTIAL/PLATELET
BASOS: 1 %
Basophils Absolute: 0 10*3/uL (ref 0.0–0.2)
EOS (ABSOLUTE): 0.1 10*3/uL (ref 0.0–0.4)
Eos: 3 %
HEMATOCRIT: 38 % (ref 34.0–46.6)
HEMOGLOBIN: 12.5 g/dL (ref 11.1–15.9)
IMMATURE GRANULOCYTES: 0 %
Immature Grans (Abs): 0 10*3/uL (ref 0.0–0.1)
Lymphocytes Absolute: 0.7 10*3/uL (ref 0.7–3.1)
Lymphs: 19 %
MCH: 23.9 pg — ABNORMAL LOW (ref 26.6–33.0)
MCHC: 32.9 g/dL (ref 31.5–35.7)
MCV: 73 fL — AB (ref 79–97)
MONOCYTES: 12 %
MONOS ABS: 0.5 10*3/uL (ref 0.1–0.9)
NEUTROS PCT: 65 %
Neutrophils Absolute: 2.5 10*3/uL (ref 1.4–7.0)
PLATELETS: 304 10*3/uL (ref 150–450)
RBC: 5.23 x10E6/uL (ref 3.77–5.28)
RDW: 16.5 % — ABNORMAL HIGH (ref 11.7–15.4)
WBC: 3.9 10*3/uL (ref 3.4–10.8)

## 2018-05-22 NOTE — Telephone Encounter (Signed)
-----   Message from Asa Lente, MD sent at 05/22/2018  8:34 AM EST ----- Please let the patient know that the lab work is ok --- the lymphocytes are same as last check (low normal) so lets keep the Aubagio every other day

## 2018-05-22 NOTE — Telephone Encounter (Signed)
I called and spoke with pt about lab results per Dr. Sater note. She verbalized understanding. 

## 2018-08-27 ENCOUNTER — Telehealth: Payer: Self-pay | Admitting: *Deleted

## 2018-08-27 NOTE — Telephone Encounter (Signed)
Called and spoke with pt. Advised we received fax from [facility] that dispenses Aubagio. They have been unable to reach her. She states she already spoke with them. She takes Aubagio every other day and not due for refill yet. I provided her their number: [phone]. Nothing further needed.

## 2018-10-27 IMAGING — CR DG CHEST 2V
2 series · 2 of 2 positions shown · non-contrast
Comparison: 10/18/2007

CLINICAL DATA: Inconclusive PPD test.

EXAM:
CHEST - 2 VIEW

[w chest pa]
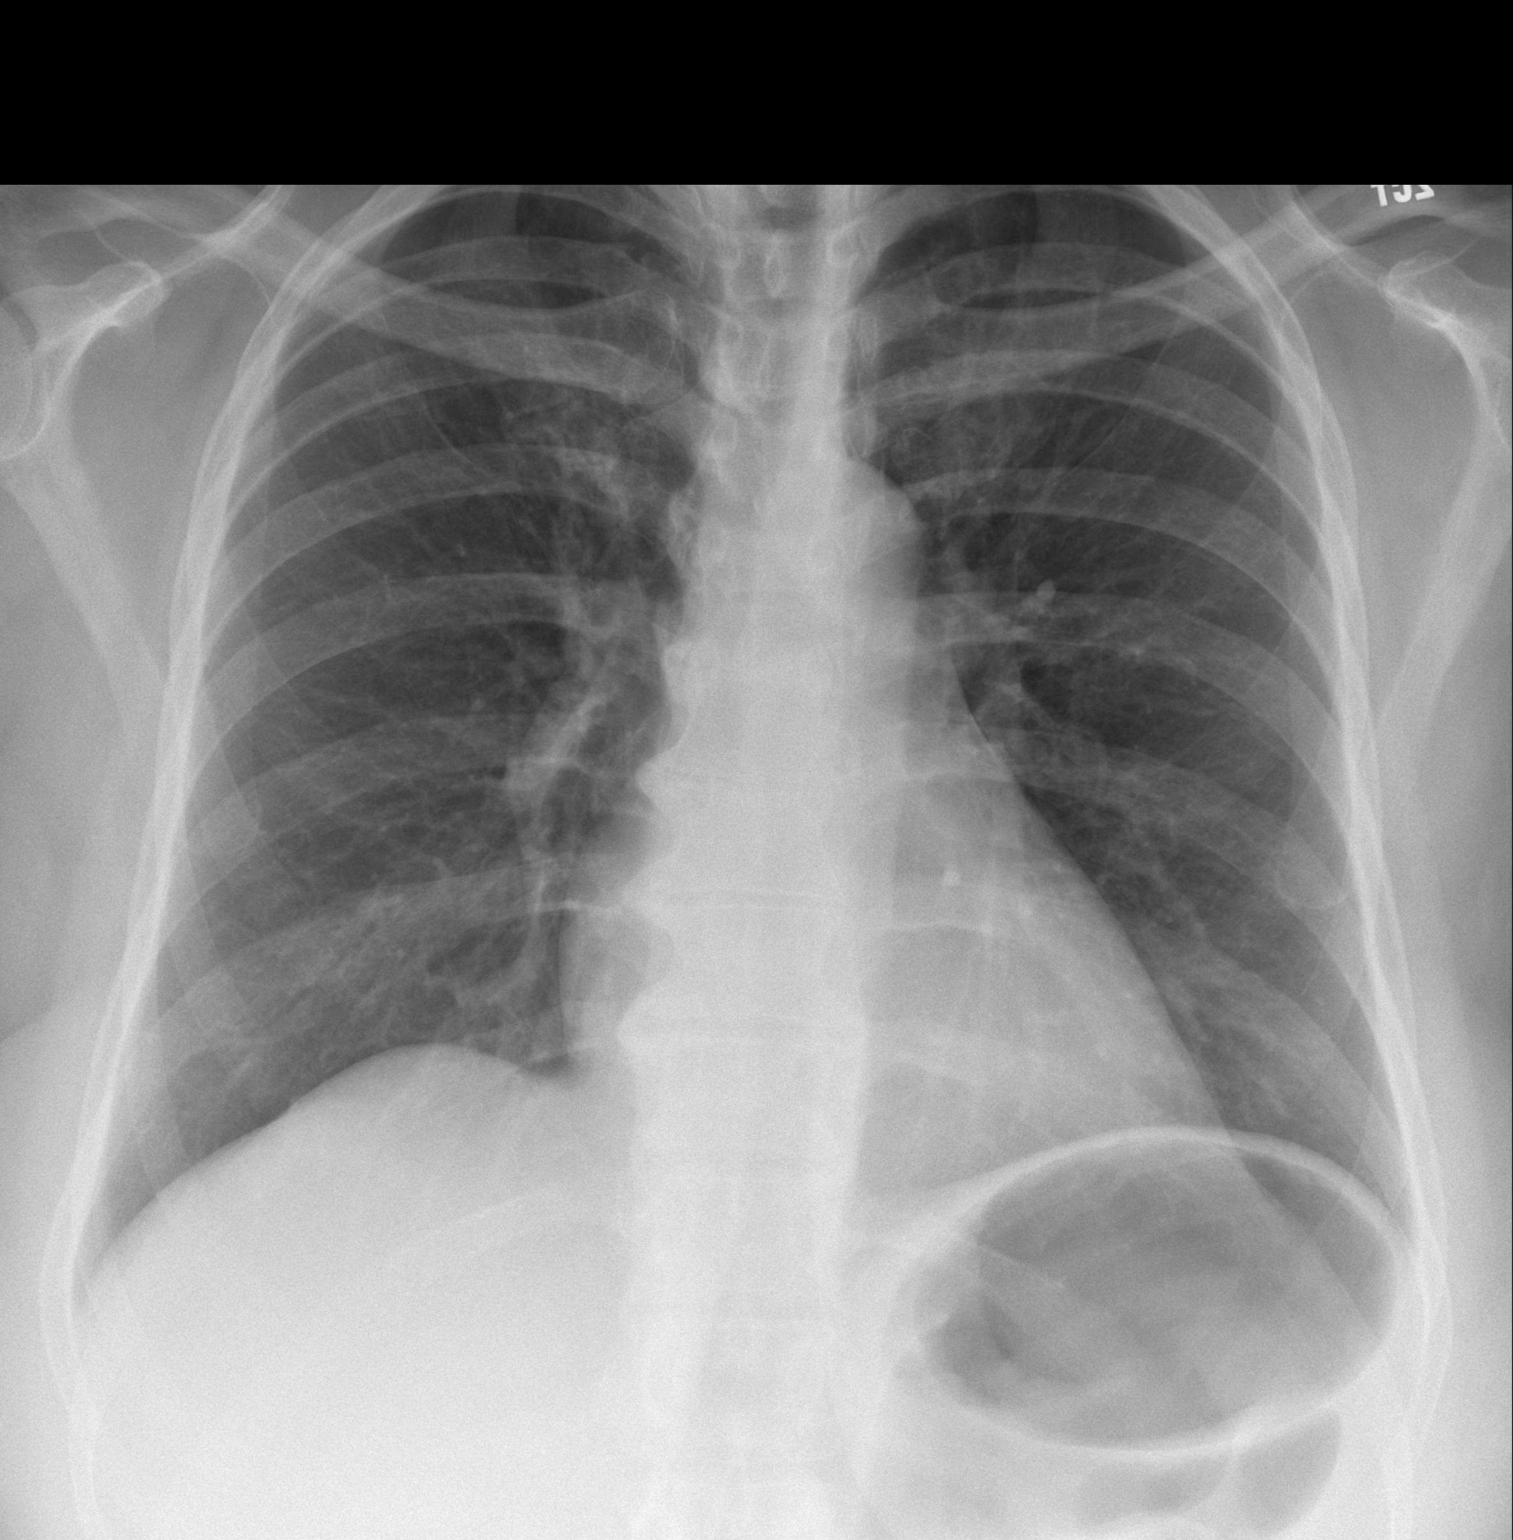

[w chest lat]
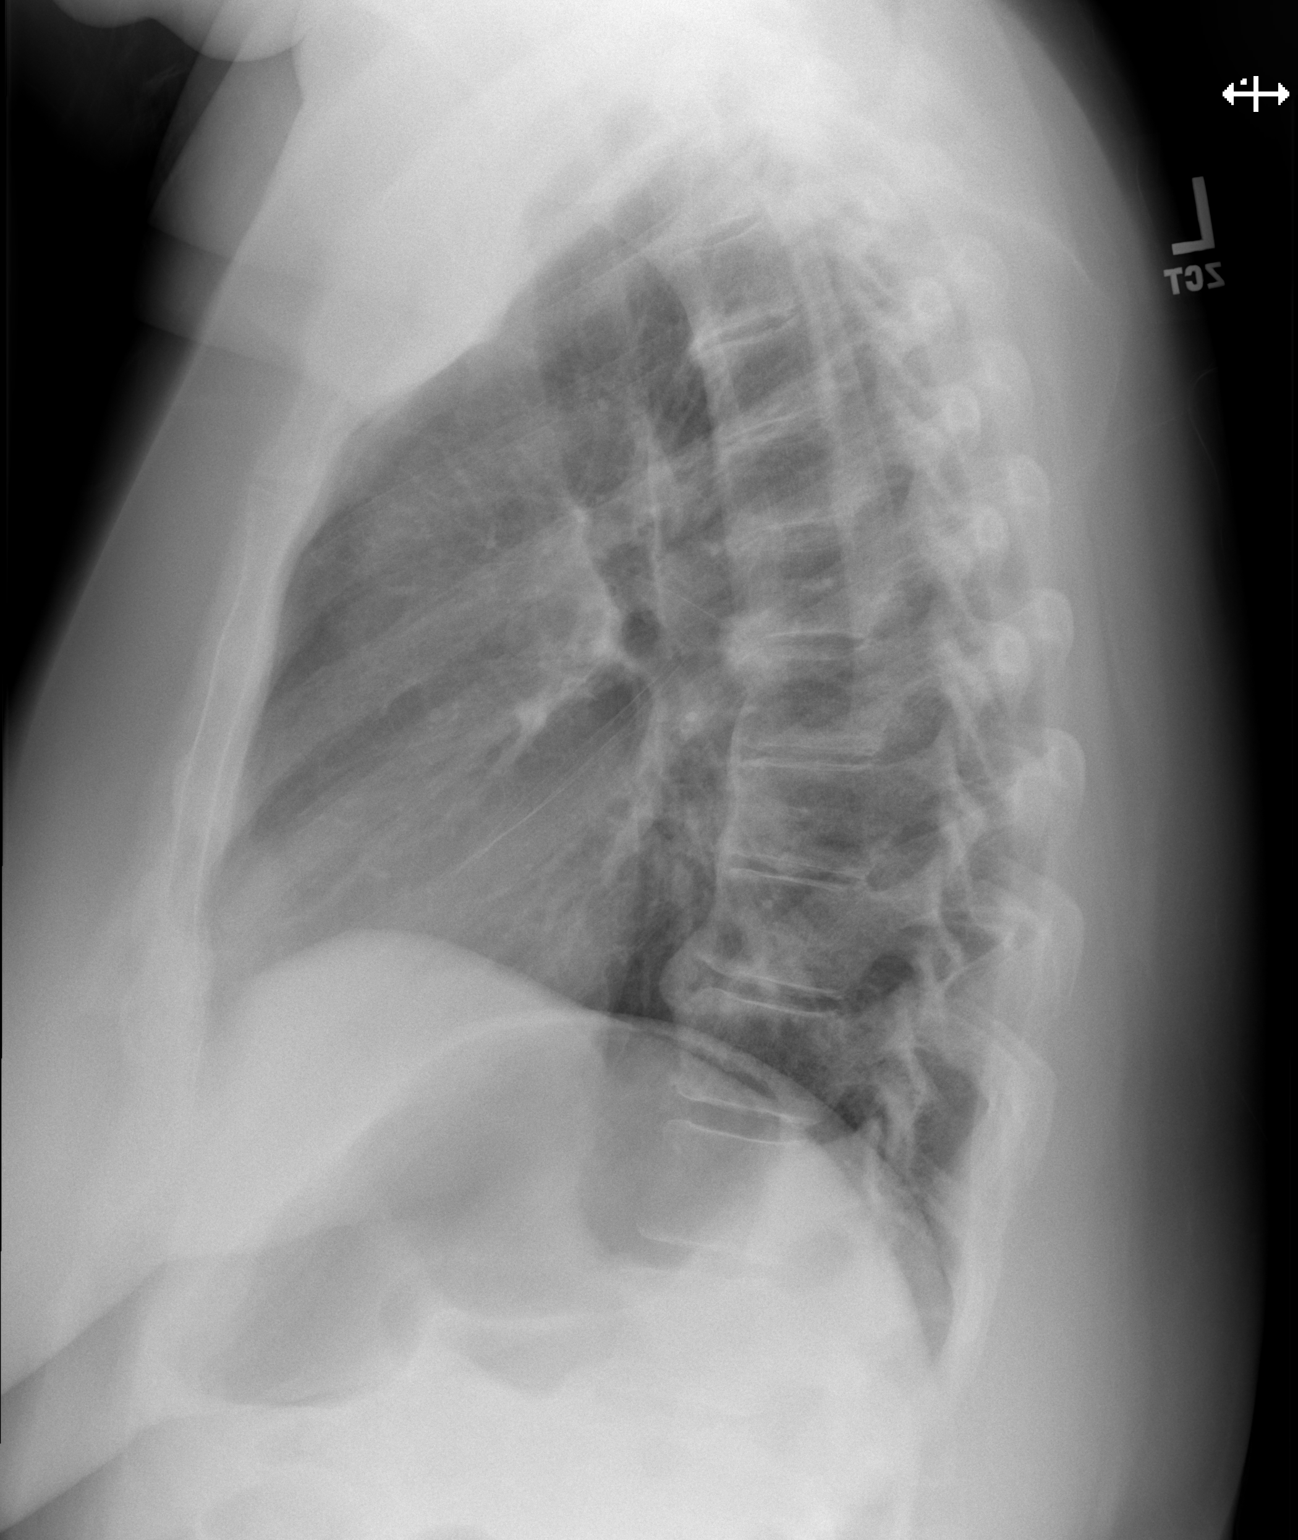

[2 of 2 positions shown; findings below may reference images not displayed]

FINDINGS: Normal heart size. Lungs clear. No pneumothorax. No pleural
effusion.
IMPRESSION: No active cardiopulmonary disease.

## 2018-11-06 ENCOUNTER — Telehealth: Payer: Self-pay | Admitting: Neurology

## 2018-11-06 NOTE — Telephone Encounter (Signed)
I called patient and LVM regarding rescheduling 9/4 appt due to office being closed each Friday. Requested pt call back to r/s.

## 2018-11-22 ENCOUNTER — Ambulatory Visit: Payer: Medicare Other | Admitting: Neurology

## 2019-01-08 ENCOUNTER — Encounter: Payer: Self-pay | Admitting: Neurology

## 2019-01-08 ENCOUNTER — Other Ambulatory Visit: Payer: Self-pay

## 2019-01-08 ENCOUNTER — Ambulatory Visit (INDEPENDENT_AMBULATORY_CARE_PROVIDER_SITE_OTHER): Payer: Medicare Other | Admitting: Neurology

## 2019-01-08 VITALS — BP 141/70 | HR 80 | Temp 97.4°F | Ht 70.0 in | Wt 204.5 lb

## 2019-01-08 DIAGNOSIS — G35 Multiple sclerosis: Secondary | ICD-10-CM | POA: Diagnosis not present

## 2019-01-08 DIAGNOSIS — D72810 Lymphocytopenia: Secondary | ICD-10-CM | POA: Diagnosis not present

## 2019-01-08 DIAGNOSIS — R269 Unspecified abnormalities of gait and mobility: Secondary | ICD-10-CM

## 2019-01-08 DIAGNOSIS — Z79899 Other long term (current) drug therapy: Secondary | ICD-10-CM

## 2019-01-08 NOTE — Progress Notes (Signed)
[facility]  PATIENT: Margaret Henderson DOB: [birth year]  REFERRING DOCTOR OR PCP:  Dr. Lonie Peak [facility]), Neuro:  Lincoln Brigham SOURCE: paitent and records form [facility]  _________________________________   HISTORICAL  CHIEF COMPLAINT:  Chief Complaint  Patient presents with  . Follow-up    RM 13, alone. Last seen 05/21/2018.  . Multiple Sclerosis    On Aubagio 14 mg po qod.     HISTORY OF PRESENT ILLNESS:  Margaret Henderson is a 53 y.o. woman with RRMS.     Update 01/08/2019: She feels her MS is stable.   She is on Aubagio and tolerates it well.  She takes qod as lymphocytes were low (they were lower on Tecfidera).    She is walking well and balance is good.   Her strength and sensation is good.    Vision is fine.   Bladder function is doing well.  She denies much fatigue and she sleeps well.   Mood and cognition are doing well.      Update 05/21/2018: She is on Aubagio and tolerates it well.   She switched from Tecfidera due to low lymphocyte counts and I had her do qod Aubagio as lymphocytes were low at first.    She has no exacerbations or new symptoms.   She is exercising several times a week and has no problems with walking.  She can do stairs.     She feels vision is fine.   Bladder function is fine.    She denies much fatigue.   Her father passed last week (prostate cancer to bone and liver) but she feels she is holding up well.      She sleeps well some nights but if she wakes up she has trouble falling back asleep.   She works day shifts only.    She does not want any medication.      Update 11/02/2017: She is tolerating Aubagio well.   She switched from Tecfidera due to low lymphocyte counts (0.5).   No exacerbations or new symptoms   last MRI was March 2019 and did not show any new lesions (she was on Tecfidera at the time).  Gait, strength and sensation are fine.  No spasticity or dysesthesia.   She has some eye twitching.    She denies change  in vision. She has mild urinary frequency.     She reports only mild fatigue.  She tries to stay out of the strong heat and does well most days.   She is sleeping well.      Mood and cognition are fine.  Update 07/02/2017: She was on Tecfidera for MS.   Her lymphocyte count was low at 0.5 on 05/04/17 CBC and repeat value was even lower at 0.3 on 06/26/2017.   Therefore, I asked her to come back in to discuss other treatment options as her risk of PML may be higher with prolonged low lymphocyte counts on Tecfidera.  Before Tecfidera, her only other medication was Betaseron but she broke through.      We went over several different options.  She was most interested in staying on a pill.  As her lymphocytes are low, I would prefer her to switch to Aubagio rather than Gilenya, siponimod or Cladribine.  She has given some thought to pregnancy (she is in a second marriage) and we discuss that if she does get her tubal ligation reversed that around that time we would get her off of Aubagio  with a washout protocol.    She denies any new MS symptoms since her last visit.  She walks on a regular basis and tries to exercise daily.  She denies any numbness or weakness.   Update 05/04/2017: She feels her MS is doing well. Specifically she has not had any exacerbations or new impairments. She is on Tecfidera and tolerates it well. She has had some borderline lymphocyte readings understands that if she gets a couple of low readings in a row we might consider a different disease modifying therapy.  She denies any significant impairments. Specifically there is no difficulty with her gait and she can easily walk a mile she had 2. There is no numbness or weakness. Bladder function is fine. Vision is fine. She has a little bit of fatigue now and then but no more than people she knows. It does not interfere with getting tasks done. She denies any depression. She sleeps well. Cognition is fine.  From 08/25/16: MS/transverse  myelitis:   In 2016, an MRI of the brain, low she was on Betaseron, showed some new lesions. Therefore, therapy was changed and she started [place] and 2016. She is tolerating it well. She started Tecfidera in 2016 and is tolerating it well.    She never had any GI symptoms. Flushing was mild. She has not had any exacerbations.    She is active and goes to the gym daily and walks 20 miles/week.    An MRI of the brain 09/15/15 showed no lesions while on Tecfidera.     Gait/strength/sensation: Gait is doing well and she has no difficulty with balance. She has no difficulty climbing stairs.   She can climb ladders.    There is no numbness or weakness in the arms or legs. The numbness that occurred with transverse myelitis resolved. There is no problems with coordination.     Bladder: Bladder and bowel function is fine. She denies nocturia.   No constipation.    Vision: She has not had any MS related difficulties with her vision. There is no history of optic neuritis or diplopia. No problems with color vision  Mood/cognition:  Mood is fine. She denies any depression or anxiety. There is no significant cognitive dysfunction.    Fatigue/sleep:   She denies any significant problem with fatigue. She stays active and walks or exercises daily.   She usually does not have heat intolerance.   She feels that she sleeps well at night.  She is getting 6-7 hours of sleep.  Vitamin D deficiency. She continues on supplementation.   She takes 4000 U/day  MS History:  She was diagnosed with multiple sclerosis in 2002 after presenting with numbness below her waist and clumsiness in the arms and legs. At that time, she was found to have an MRI consistent with multiple sclerosis. She also underwent a lumbar puncture and CSF was  consistent with multiple sclerosis. She was started on Betaseron and has continued on Betaseron. Last year, an MRI of the brain showed 2 new foci not present in her earlier MRI. She had MRI's mid  2016 at [facility]. . The brain and cervical spine were unchanged but her thoracic spine showed a new focus compared to 2015.   Other:   She has thalassemia and needs to take iron.    Last HgB was 11.8.     REVIEW OF SYSTEMS: Constitutional: No fevers, chills, sweats, or change in appetite.   Rare fatigue Eyes: No visual changes, double vision,  eye pain Ear, nose and throat: No hearing loss, ear pain, nasal congestion, sore throat Cardiovascular: No chest pain, palpitations Respiratory: No shortness of breath at rest or with exertion.   No wheezes GastrointestinaI: No nausea, vomiting, diarrhea, abdominal pain, fecal incontinence Genitourinary: No dysuria, urinary retention or frequency.  No nocturia. Musculoskeletal: No neck pain, back pain Integumentary: No rash, pruritus, skin lesions Neurological: as above Psychiatric: No depression at this time.  No anxiety Endocrine: No palpitations, diaphoresis, change in appetite, change in weigh or increased thirst Hematologic/Lymphatic: No anemia, purpura, petechiae. Allergic/Immunologic: No itchy/runny eyes, nasal congestion, recent allergic reactions, rashes  ALLERGIES: No Known Allergies  HOME MEDICATIONS:  Current Outpatient Medications:  .  amLODipine (NORVASC) 5 MG tablet, Take 5 mg by mouth daily., Disp: , Rfl: 4 .  Biotin 1000 MCG tablet, Take 1,000 mcg by mouth 3 (three) times daily., Disp: , Rfl:  .  cholecalciferol (VITAMIN D) 1000 UNITS tablet, Take 2,000 Units by mouth 2 (two) times daily., Disp: , Rfl:  .  Collagen 500 MG CAPS, Take by mouth., Disp: , Rfl:  .  ferrous sulfate 325 (65 FE) MG tablet, Take 325 mg by mouth daily with breakfast., Disp: , Rfl:  .  Teriflunomide (AUBAGIO) 14 MG TABS, Take 14 mg by mouth daily. (Patient taking differently: Take 14 mg by mouth every other day. ), Disp: 90 tablet, Rfl: 3  PAST MEDICAL HISTORY: Past Medical History:  Diagnosis Date  . Hypertension   . Multiple sclerosis ([place])   .  Vision abnormalities     PAST SURGICAL HISTORY: Past Surgical History:  Procedure Laterality Date  . TUBAL LIGATION  1994    FAMILY HISTORY: Family History  Problem Relation Age of Onset  . Anemia Mother   . Diabetes Mother   . Hypertension Mother     SOCIAL HISTORY:  Social History   Socioeconomic History  . Marital status: Single    Spouse name: Not on file  . Number of children: 2  . Years of education: Not on file  . Highest education level: Not on file  Occupational History  . Occupation: Medical records  Social Needs  . Financial resource strain: Not on file  . Food insecurity    Worry: Not on file    Inability: Not on file  . Transportation needs    Medical: Not on file    Non-medical: Not on file  Tobacco Use  . Smoking status: Never Smoker  . Smokeless tobacco: Never Used  Substance and Sexual Activity  . Alcohol use: No    Alcohol/week: 0.0 standard drinks  . Drug use: No  . Sexual activity: Not on file  Lifestyle  . Physical activity    Days per week: Not on file    Minutes per session: Not on file  . Stress: Not on file  Relationships  . Social Herbalist on phone: Not on file    Gets together: Not on file    Attends religious service: Not on file    Active member of club or organization: Not on file    Attends meetings of clubs or organizations: Not on file    Relationship status: Not on file  . Intimate partner violence    Fear of current or ex partner: Not on file    Emotionally abused: Not on file    Physically abused: Not on file    Forced sexual activity: Not on file  Other Topics Concern  .  Not on file  Social History Narrative   Right handed    Caffeine use: Tea sometimes.     PHYSICAL EXAM  Vitals:   01/08/19 1447  BP: (!) 141/70  Pulse: 80  Temp: (!) 97.4 F (36.3 C)  SpO2: 99%  Weight: 204 lb 8 oz (92.8 kg)  Height: 5\' 10"  (1.778 m)    Body mass index is 29.34 kg/m.   General: The patient is  well-developed and well-nourished and in no acute distress   Neurologic Exam  Mental status: The patient is alert and oriented x 3 at the time of the examination. The patient has apparent normal recent and remote memory, with an apparently normal attention span and concentration ability.   Speech is normal.  Cranial nerves: Extraocular movements are full.  Color vision was normal and symmetric.  Facial strength and sensation was normal.  Trapezius strength is strong.. No obvious hearing deficits are noted.  Motor:  Muscle bulk is normal.   Muscle tone is normal. Strength is 5/5.   Sensory: Sensory testing showed normal sensation to temperature and vibration sensation in the arms and legs  Coordination: Finger-nose-finger is normal in both arms.  Heel-to-shin normal in both arms.  Gait and station: Station is normal.   The gait is normal.  Tandem gait is just slightly wide.  Romberg is negative.  Reflexes: Deep tendon reflexes are symmetric and normal bilaterally.      DIAGNOSTIC DATA (LABS, IMAGING, TESTING) - I reviewed patient records, labs, notes, testing and imaging myself where available.     ASSESSMENT AND PLAN  Multiple sclerosis (HCC) - Plan: CBC with Differential/Platelet, Hepatic function panel  High risk medication use - Plan: CBC with Differential/Platelet, Hepatic function panel  Lymphocytopenia  Gait disturbance  1.   Continue Aubagio. Check CBC with differential and Hep panel.  Consider going back to 14 mg daily if lymphocytes higher than last visit.  2.  Continue vitamin D supplements. 3.   Stay active and exercise.   Return to clinic 6 months or sooner if there are new or worsening neurologic symptoms.   Richard A. Epimenio Foot, MD, PhD, [place]    01/08/19   3:22 PM Certified in Neurology, Clinical Neurophysiology, Sleep Medicine, Pain Medicine and Neuroimaging Director, [facility] at [facility]  [facility]  [address] [place], [place] [address] [phone]

## 2019-01-09 ENCOUNTER — Telehealth: Payer: Self-pay | Admitting: *Deleted

## 2019-01-09 LAB — HEPATIC FUNCTION PANEL
ALT: 19 IU/L (ref 0–32)
AST: 19 IU/L (ref 0–40)
Albumin: 4.2 g/dL (ref 3.8–4.9)
Alkaline Phosphatase: 92 IU/L (ref 39–117)
Bilirubin Total: <0.2 mg/dL (ref 0.0–1.2)
Bilirubin, Direct: 0.06 mg/dL (ref 0.00–0.40)
Total Protein: 7.1 g/dL (ref 6.0–8.5)

## 2019-01-09 LAB — CBC WITH DIFFERENTIAL/PLATELET
Basophils Absolute: 0 10*3/uL (ref 0.0–0.2)
Basos: 1 %
EOS (ABSOLUTE): 0.1 10*3/uL (ref 0.0–0.4)
Eos: 3 %
Hematocrit: 38.2 % (ref 34.0–46.6)
Hemoglobin: 12.1 g/dL (ref 11.1–15.9)
Immature Grans (Abs): 0 10*3/uL (ref 0.0–0.1)
Immature Granulocytes: 0 %
Lymphocytes Absolute: 1 10*3/uL (ref 0.7–3.1)
Lymphs: 22 %
MCH: 22.7 pg — ABNORMAL LOW (ref 26.6–33.0)
MCHC: 31.7 g/dL (ref 31.5–35.7)
MCV: 72 fL — ABNORMAL LOW (ref 79–97)
Monocytes Absolute: 0.5 10*3/uL (ref 0.1–0.9)
Monocytes: 11 %
Neutrophils Absolute: 2.8 10*3/uL (ref 1.4–7.0)
Neutrophils: 63 %
Platelets: 354 10*3/uL (ref 150–450)
RBC: 5.32 x10E6/uL — ABNORMAL HIGH (ref 3.77–5.28)
RDW: 17.1 % — ABNORMAL HIGH (ref 11.7–15.4)
WBC: 4.4 10*3/uL (ref 3.4–10.8)

## 2019-01-09 NOTE — Telephone Encounter (Signed)
Called and spoke with pt. Relayed results per Dr Felecia Shelling now. She has phone visit at 330pm w/ PCP today. She will let them know hemoglobin 12.1 and MCV 72. Instructed she can go back to taking Aubagio 14mg  po daily per Dr. Felecia Shelling. She verbalized understanding and appreciation.

## 2019-01-09 NOTE — Telephone Encounter (Signed)
-----   Message from Britt Bottom, MD sent at 01/09/2019 12:26 PM EDT ----- Please let the patient know that the lab work is ok.  However, Iron may be a little low and she can take an OTC supp

## 2019-01-10 ENCOUNTER — Telehealth: Payer: Self-pay | Admitting: Neurology

## 2019-01-10 NOTE — Telephone Encounter (Signed)
Pt called and stated that her pharmacy informed her that the authorization for her Teriflunomide (AUBAGIO) 14 MG TABS has expired and they are needing a new one. Please advise.

## 2019-01-13 ENCOUNTER — Other Ambulatory Visit: Payer: Self-pay | Admitting: *Deleted

## 2019-01-13 DIAGNOSIS — G35 Multiple sclerosis: Secondary | ICD-10-CM

## 2019-01-13 MED ORDER — AUBAGIO 14 MG PO TABS: 14.0000 mg | ORAL_TABLET | Freq: Every day | ORAL | 3 refills | Status: DC

## 2019-01-13 NOTE — Telephone Encounter (Signed)
I called pt and relayed below information. Pt verbalized understanding.

## 2019-01-13 NOTE — Telephone Encounter (Signed)
Tried initiating PA Aubagio on CMM. Received the following response: "This medication or product is on your plan's list of covered drugs. Prior authorization is not required at this time. If your pharmacy has questions regarding the processing of your prescription, please have them call the [facility] pharmacy help desk at [phone]. **Please note: Formulary lowering, tiering exception, cost reduction and/or pre-benefit determination review (including prospective Medicare hospice reviews) requests cannot be requested using this method of submission. Please contact us at [phone] instead."   I reviewed pt chart and current rx on file expired. I e-scribed new rx to [facility].

## 2019-07-10 ENCOUNTER — Ambulatory Visit (INDEPENDENT_AMBULATORY_CARE_PROVIDER_SITE_OTHER): Payer: Medicare Other | Admitting: Family Medicine

## 2019-07-10 ENCOUNTER — Other Ambulatory Visit: Payer: Self-pay

## 2019-07-10 ENCOUNTER — Encounter: Payer: Self-pay | Admitting: Family Medicine

## 2019-07-10 VITALS — BP 172/85 | HR 72 | Temp 97.1°F | Ht 70.0 in | Wt 204.0 lb

## 2019-07-10 DIAGNOSIS — Z79899 Other long term (current) drug therapy: Secondary | ICD-10-CM

## 2019-07-10 DIAGNOSIS — G35 Multiple sclerosis: Secondary | ICD-10-CM

## 2019-07-10 DIAGNOSIS — E559 Vitamin D deficiency, unspecified: Secondary | ICD-10-CM

## 2019-07-10 NOTE — Progress Notes (Signed)
I have read the note, and I agree with the clinical assessment and plan.  Eliyas Suddreth A. Tieasha Larsen, MD, PhD, FAAN Certified in Neurology, Clinical Neurophysiology, Sleep Medicine, Pain Medicine and Neuroimaging  [facility] [address], [address] [place], [place] [address] [phone]  

## 2019-07-10 NOTE — Patient Instructions (Signed)
We will continue current treatment plan  Will update labs today  Stay well hydrated. Healthy well balanced diet and regular exercise encouraged.   Follow up in 6 months   Multiple Sclerosis Multiple sclerosis (MS) is a disease of the brain, spinal cord, and optic nerves (central nervous system). It causes the body's disease-fighting (immune) system to destroy the protective covering (myelin sheath) around nerves in the brain. When this happens, signals (nerve impulses) going to and from the brain and spinal cord do not get sent properly or may not get sent at all. There are several types of MS:  Relapsing-remitting MS. This is the most common type. This causes sudden attacks of symptoms. After an attack, you may recover completely until the next attack, or some symptoms may remain permanently.  Secondary progressive MS. This usually develops after the onset of relapsing-remitting MS. Similar to relapsing-remitting MS, this type also causes sudden attacks of symptoms. Attacks may be less frequent, but symptoms slowly get worse (progress) over time.  Primary progressive MS. This causes symptoms that steadily progress over time. This type of MS does not cause sudden attacks of symptoms. The age of onset of MS varies, but it often develops between 32-95 years of age. MS is a lifelong (chronic) condition. There is no cure, but treatment can help slow down the progression of the disease. What are the causes? The cause of this condition is not known. What increases the risk? You are more likely to develop this condition if:  You are a woman.  You have a relative with MS. However, the condition is not passed from parent to child (inherited).  You have a lack (deficiency) of vitamin D.  You smoke. MS is more common in the [place] than in the [place]. What are the signs or symptoms? Relapsing-remitting and secondary progressive MS cause symptoms to occur in  episodes or attacks that may last weeks to months. There may be long periods between attacks in which there are almost no symptoms. Primary progressive MS causes symptoms to steadily progress after they develop. Symptoms of MS vary because of the many different ways it affects the central nervous system. The main symptoms include:  Vision problems and eye pain.  Numbness.  Weakness.  Inability to move your arms, hands, feet, or legs (paralysis).  Balance problems.  Shaking that you cannot control (tremors).  Muscle spasms.  Problems with thinking (cognitive changes). MS can also cause symptoms that are associated with the disease, but are not always the direct result of an MS attack. They may include:  Inability to control urination or bowel movements (incontinence).  Headaches.  Fatigue.  Inability to tolerate heat.  Emotional changes.  Depression.  Pain. How is this diagnosed? This condition is diagnosed based on:  Your symptoms.  A neurological exam. This involves checking central nervous system function, such as nerve function, reflexes, and coordination.  MRIs of the brain and spinal cord.  Lab tests, including a lumbar puncture that tests the fluid that surrounds the brain and spinal cord (cerebrospinal fluid).  Tests to measure the electrical activity of the brain in response to stimulation (evoked potentials). How is this treated? There is no cure for MS, but medicines can help decrease the number and frequency of attacks and help relieve nuisance symptoms. Treatment options may include:  Medicines that reduce the frequency of attacks. These medicines may be given by injection, by mouth (orally), or through an IV.  Medicines that reduce  inflammation (steroids). These may provide short-term relief of symptoms.  Medicines to help control pain, depression, fatigue, or incontinence.  Vitamin D, if you have a deficiency.  Using devices to help you move  around (assistive devices), such as braces, a cane, or a walker.  Physical therapy to strengthen and stretch your muscles.  Occupational therapy to help you with everyday tasks.  Alternative or complementary treatments such as exercise, massage, or acupuncture. Follow these instructions at home:  Take over-the-counter and prescription medicines only as told by your health care provider.  Do not drive or use heavy machinery while taking prescription pain medicine.  Use assistive devices as recommended by your physical therapist or your health care provider.  Exercise as directed by your health care provider.  Return to your normal activities as told by your health care provider. Ask your health care provider what activities are safe for you.  Reach out for support. Share your feelings with friends, family, or a support group.  Keep all follow-up visits as told by your health care provider and therapists. This is important. Where to find more information  National Multiple Sclerosis Society: https://www.nationalmssociety.org Contact a health care provider if:  You feel depressed.  You develop new pain or numbness.  You have tremors.  You have problems with sexual function. Get help right away if:  You develop paralysis.  You develop numbness.  You have problems with your bladder or bowel function.  You develop double vision.  You lose vision in one or both eyes.  You develop suicidal thoughts.  You develop severe confusion. If you ever feel like you may hurt yourself or others, or have thoughts about taking your own life, get help right away. You can go to your nearest emergency department or call:  Your local emergency services (911 in the [place]  A suicide crisis helpline, such as the [facility] at 984-752-4220. This is open 24 hours a day. Summary  Multiple sclerosis (MS) is a disease of the central nervous system that causes the  body's immune system to destroy the protective covering (myelin sheath) around nerves in the brain.  There are 3 types of MS: relapsing-remitting, secondary progressive, and primary progressive. Relapsing-remitting and secondary progressive MS cause symptoms to occur in episodes or attacks that may last weeks to months. Primary progressive MS causes symptoms to steadily progress after they develop.  There is no cure for MS, but medicines can help decrease the number and frequency of attacks and help relieve nuisance symptoms. Treatment may also include physical or occupational therapy.  If you develop numbness, paralysis, vision problems, or other neurological symptoms, get help right away. This information is not intended to replace advice given to you by your health care provider. Make sure you discuss any questions you have with your health care provider. Document Revised: 02/16/2017 Document Reviewed: 05/15/2016 Elsevier Patient Education  2020 [employer].

## 2019-07-10 NOTE — Progress Notes (Signed)
PATIENT: Margaret Henderson DOB: Feb 27, 1966  REASON FOR VISIT: follow up HISTORY FROM: patient  Chief Complaint  Patient presents with  . Follow-up    Rm 2, alone. No concerns  . Multiple Sclerosis     HISTORY OF PRESENT ILLNESS: Today 07/10/19 Margaret Henderson is a 54 y.o. female here today for follow up. She is taking Aubagio daily and tolerating well. She denies any changes since last being seen. She is feeling pretty good. No vision, gait, bowel or bladder changes.   She continues to work full time. She did lose her father in 04/2018 due to cancer, her brother to COVID in 11/2018 and reports that her mother is now being treated for dementia. She feels that she is holding up quite well considering. She does take amlodipine 5mg  at night for HTN. Her BP is elevated today but she feels this is due to white coat. She checks at home and readings are usually 120-140/70-80. She denies vision changes, headaches, chest pain or trouble breathing.     HISTORY: (copied from Dr note on 01/08/2019)  She feels her MS is stable.   She is on Aubagio and tolerates it well.  She takes qod as lymphocytes were low (they were lower on Tecfidera).    She is walking well and balance is good.   Her strength and sensation is good.    Vision is fine.   Bladder function is doing well.  She denies much fatigue and she sleeps well.   Mood and cognition are doing well.      Update 05/21/2018: She is on Aubagio and tolerates it well.   She switched from Tecfidera due to low lymphocyte counts and I had her do qod Aubagio as lymphocytes were low at first.    She has no exacerbations or new symptoms.   She is exercising several times a week and has no problems with walking.  She can do stairs.     She feels vision is fine.   Bladder function is fine.    She denies much fatigue.   Her father passed last week (prostate cancer to bone and liver) but she feels she is holding up well.      She sleeps well some nights but if  she wakes up she has trouble falling back asleep.   She works day shifts only.    She does not want any medication.      Update 11/02/2017: She is tolerating Aubagio well.   She switched from Tecfidera due to low lymphocyte counts (0.5).   No exacerbations or new symptoms   last MRI was March 2019 and did not show any new lesions (she was on Tecfidera at the time).  Gait, strength and sensation are fine.  No spasticity or dysesthesia.   She has some eye twitching.    She denies change in vision. She has mild urinary frequency.     She reports only mild fatigue.  She tries to stay out of the strong heat and does well most days.   She is sleeping well.      Mood and cognition are fine.  Update 07/02/2017: She was on Tecfidera for MS.   Her lymphocyte count was low at 0.5 on 05/04/17 CBC and repeat value was even lower at 0.3 on 06/26/2017.   Therefore, I asked her to come back in to discuss other treatment options as her risk of PML may be higher with prolonged low lymphocyte  counts on Tecfidera.  Before Tecfidera, her only other medication was Betaseron but she broke through.      We went over several different options.  She was most interested in staying on a pill.  As her lymphocytes are low, I would prefer her to switch to Aubagio rather than Gilenya, siponimod or Cladribine.  She has given some thought to pregnancy (she is in a second marriage) and we discuss that if she does get her tubal ligation reversed that around that time we would get her off of Aubagio with a washout protocol.    She denies any new MS symptoms since her last visit.  She walks on a regular basis and tries to exercise daily.  She denies any numbness or weakness.   Update 05/04/2017: She feels her MS is doing well. Specifically she has not had any exacerbations or new impairments. She is on Tecfidera and tolerates it well. She has had some borderline lymphocyte readings understands that if she gets a couple of low readings  in a row we might consider a different disease modifying therapy.  She denies any significant impairments. Specifically there is no difficulty with her gait and she can easily walk a mile she had 2. There is no numbness or weakness. Bladder function is fine. Vision is fine. She has a little bit of fatigue now and then but no more than people she knows. It does not interfere with getting tasks done. She denies any depression. She sleeps well. Cognition is fine.  From 08/25/16: MS/transverse myelitis:   In 2016, an MRI of the brain, low she was on Betaseron, showed some new lesions. Therefore, therapy was changed and she started [place] and 2016. She is tolerating it well. She started Tecfidera in 2016 and is tolerating it well.    She never had any GI symptoms. Flushing was mild. She has not had any exacerbations.    She is active and goes to the gym daily and walks 20 miles/week.    An MRI of the brain 09/15/15 showed no lesions while on Tecfidera.     Gait/strength/sensation: Gait is doing well and she has no difficulty with balance. She has no difficulty climbing stairs.   She can climb ladders.    There is no numbness or weakness in the arms or legs. The numbness that occurred with transverse myelitis resolved. There is no problems with coordination.     Bladder: Bladder and bowel function is fine. She denies nocturia.   No constipation.    Vision: She has not had any MS related difficulties with her vision. There is no history of optic neuritis or diplopia. No problems with color vision  Mood/cognition:  Mood is fine. She denies any depression or anxiety. There is no significant cognitive dysfunction.    Fatigue/sleep:   She denies any significant problem with fatigue. She stays active and walks or exercises daily.   She usually does not have heat intolerance.   She feels that she sleeps well at night.  She is getting 6-7 hours of sleep.  Vitamin D deficiency. She continues on  supplementation.   She takes 4000 U/day  MS History:  She was diagnosed with multiple sclerosis in 2002 after presenting with numbness below her waist and clumsiness in the arms and legs. At that time, she was found to have an MRI consistent with multiple sclerosis. She also underwent a lumbar puncture and CSF was  consistent with multiple sclerosis. She was started on Betaseron  and has continued on Betaseron. Last year, an MRI of the brain showed 2 new foci not present in her earlier MRI. She had MRI's mid 2016 at [facility]. . The brain and cervical spine were unchanged but her thoracic spine showed a new focus compared to 2015.   Other:   She has thalassemia and needs to take iron.    Last HgB was 11.8.     REVIEW OF SYSTEMS: Out of a complete 14 system review of symptoms, the patient complains only of the following symptoms, none and all other reviewed systems are negative.   ALLERGIES: No Known Allergies  HOME MEDICATIONS: Outpatient Medications Prior to Visit  Medication Sig Dispense Refill  . amLODipine (NORVASC) 5 MG tablet Take 5 mg by mouth daily.  4  . Biotin 1000 MCG tablet Take 1,000 mcg by mouth 3 (three) times daily.    . cholecalciferol (VITAMIN D) 1000 UNITS tablet Take 2,000 Units by mouth 2 (two) times daily.    . ferrous sulfate 325 (65 FE) MG tablet Take 325 mg by mouth daily with breakfast.    . Teriflunomide (AUBAGIO) 14 MG TABS Take 14 mg by mouth daily. 90 tablet 3  . Collagen 500 MG CAPS Take by mouth.     No facility-administered medications prior to visit.    PAST MEDICAL HISTORY: Past Medical History:  Diagnosis Date  . Hypertension   . Multiple sclerosis ([place])   . Vision abnormalities     PAST SURGICAL HISTORY: Past Surgical History:  Procedure Laterality Date  . TUBAL LIGATION  1994    FAMILY HISTORY: Family History  Problem Relation Age of Onset  . Anemia Mother   . Diabetes Mother   . Hypertension Mother     SOCIAL HISTORY: Social  History   Socioeconomic History  . Marital status: Single    Spouse name: Not on file  . Number of children: 2  . Years of education: Not on file  . Highest education level: Not on file  Occupational History  . Occupation: Medical records  Tobacco Use  . Smoking status: Never Smoker  . Smokeless tobacco: Never Used  Substance and Sexual Activity  . Alcohol use: No    Alcohol/week: 0.0 standard drinks  . Drug use: No  . Sexual activity: Not on file  Other Topics Concern  . Not on file  Social History Narrative   Right handed    Caffeine use: Tea sometimes.   Social Determinants of Health   Financial Resource Strain:   . Difficulty of Paying Living Expenses:   Food Insecurity:   . Worried About Charity fundraiser in the Last Year:   . Arboriculturist in the Last Year:   Transportation Needs:   . Film/video editor (Medical):   Marland Kitchen Lack of Transportation (Non-Medical):   Physical Activity:   . Days of Exercise per Week:   . Minutes of Exercise per Session:   Stress:   . Feeling of Stress :   Social Connections:   . Frequency of Communication with Friends and Family:   . Frequency of Social Gatherings with Friends and Family:   . Attends Religious Services:   . Active Member of Clubs or Organizations:   . Attends Archivist Meetings:   Marland Kitchen Marital Status:   Intimate Partner Violence:   . Fear of Current or Ex-Partner:   . Emotionally Abused:   Marland Kitchen Physically Abused:   . Sexually Abused:  PHYSICAL EXAM  Vitals:   07/10/19 0759  BP: (!) 172/85  Pulse: 72  Temp: (!) 97.1 F (36.2 C)  Weight: 204 lb (92.5 kg)  Height: 5\' 10"  (1.778 m)   Body mass index is 29.27 kg/m.  Generalized: Well developed, in no acute distress  Cardiology: normal rate and rhythm, no murmur noted Respiratory: clear to auscultation bilaterally  Neurological examination  Mentation: Alert oriented to time, place, history taking. Follows all commands speech and language  fluent Cranial nerve II-XII: Pupils were equal round reactive to light. Extraocular movements were full, visual field were full on confrontational test. Facial sensation and strength were normal.. Head turning and shoulder shrug  were normal and symmetric. Motor: The motor testing reveals 5 over 5 strength of all 4 extremities. Good symmetric motor tone is noted throughout.  Sensory: Sensory testing is intact to soft touch on all 4 extremities. No evidence of extinction is noted.  Coordination: Cerebellar testing reveals good finger-nose-finger and heel-to-shin bilaterally.  Gait and station: Gait is normal.  Reflexes: Deep tendon reflexes are symmetric and normal bilaterally.   DIAGNOSTIC DATA (LABS, IMAGING, TESTING) - I reviewed patient records, labs, notes, testing and imaging myself where available.  No flowsheet data found.   Lab Results  Component Value Date   WBC 4.4 01/08/2019   HGB 12.1 01/08/2019   HCT 38.2 01/08/2019   MCV 72 (L) 01/08/2019   PLT 354 01/08/2019      Component Value Date/Time   NA 137 05/21/2018 0925   K 4.3 05/21/2018 0925   CL 100 05/21/2018 0925   CO2 23 05/21/2018 0925   GLUCOSE 82 05/21/2018 0925   BUN 7 05/21/2018 0925   CREATININE 0.70 05/21/2018 0925   CALCIUM 10.4 (H) 05/21/2018 0925   PROT 7.1 01/08/2019 1527   ALBUMIN 4.2 01/08/2019 1527   AST 19 01/08/2019 1527   ALT 19 01/08/2019 1527   ALKPHOS 92 01/08/2019 1527   BILITOT <0.2 01/08/2019 1527   GFRNONAA 101 05/21/2018 0925   GFRAA 117 05/21/2018 0925   No results found for: CHOL, HDL, LDLCALC, LDLDIRECT, TRIG, CHOLHDL No results found for: ZTIW5Y No results found for: VITAMINB12 No results found for: TSH   ASSESSMENT AND PLAN 54 y.o. year old female  has a past medical history of Hypertension, Multiple sclerosis (HCC), and Vision abnormalities. here with     ICD-10-CM   1. Multiple sclerosis (HCC)  G35 CBC with Differential/Platelets    CMP  2. High risk medication use   Z79.899 CBC with Differential/Platelets    CMP  3. Vitamin D deficiency  E55.9 Vitamin D, 25-hydroxy    [place] is doing well today. She denies new or exacerbating symptoms. She is tolerating Aubagio well. We will check labs today. MRI 05/2017 stable. She will continue current treatment plan. She will monitor BP closely at home. She will call PCP for any concerns of continued elevation. We have reviewed red flag warning and when to seek medical emergency attention. She verbalizes understanding. She will stay well hydrated. Well balanced diet and regular exercise advised. She will follow up in 6 months, sooner if needed. She verbalizes understanding and agreement with this plan.    Orders Placed This Encounter  Procedures  . CBC with Differential/Platelets  . CMP  . Vitamin D, 25-hydroxy     No orders of the defined types were placed in this encounter.     I spent 25 minutes with the patient. 50% of this time was spent  counseling and educating patient on plan of care and medications.    Shawnie Dapper, FNP-C 07/10/2019, 9:28 AM [facility] [address], [address] [place], [place] [address] [phone]

## 2019-07-11 LAB — VITAMIN D 25 HYDROXY (VIT D DEFICIENCY, FRACTURES): Vit D, 25-Hydroxy: 33 ng/mL (ref 30.0–100.0)

## 2019-07-11 LAB — COMPREHENSIVE METABOLIC PANEL
ALT: 20 IU/L (ref 0–32)
AST: 23 IU/L (ref 0–40)
Albumin/Globulin Ratio: 1.3 (ref 1.2–2.2)
Albumin: 4.2 g/dL (ref 3.8–4.9)
Alkaline Phosphatase: 101 IU/L (ref 39–117)
BUN/Creatinine Ratio: 14 (ref 9–23)
BUN: 9 mg/dL (ref 6–24)
Bilirubin Total: <0.2 mg/dL (ref 0.0–1.2)
CO2: 25 mmol/L (ref 20–29)
Calcium: 10.2 mg/dL (ref 8.7–10.2)
Chloride: 101 mmol/L (ref 96–106)
Creatinine, Ser: 0.66 mg/dL (ref 0.57–1.00)
GFR calc Af Amer: 118 mL/min/{1.73_m2} (ref >59)
GFR calc non Af Amer: 103 mL/min/{1.73_m2} (ref >59)
Globulin, Total: 3.2 g/dL (ref 1.5–4.5)
Glucose: 104 mg/dL — ABNORMAL HIGH (ref 65–99)
Potassium: 4.1 mmol/L (ref 3.5–5.2)
Sodium: 139 mmol/L (ref 134–144)
Total Protein: 7.4 g/dL (ref 6.0–8.5)

## 2019-07-11 LAB — CBC WITH DIFFERENTIAL/PLATELET
Basophils Absolute: 0 10*3/uL (ref 0.0–0.2)
Basos: 0 %
EOS (ABSOLUTE): 0.1 10*3/uL (ref 0.0–0.4)
Eos: 4 %
Hematocrit: 42.1 % (ref 34.0–46.6)
Hemoglobin: 13 g/dL (ref 11.1–15.9)
Immature Grans (Abs): 0 10*3/uL (ref 0.0–0.1)
Immature Granulocytes: 0 %
Lymphocytes Absolute: 0.6 10*3/uL — ABNORMAL LOW (ref 0.7–3.1)
Lymphs: 19 %
MCH: 21.9 pg — ABNORMAL LOW (ref 26.6–33.0)
MCHC: 30.9 g/dL — ABNORMAL LOW (ref 31.5–35.7)
MCV: 71 fL — ABNORMAL LOW (ref 79–97)
Monocytes Absolute: 0.4 10*3/uL (ref 0.1–0.9)
Monocytes: 12 %
Neutrophils Absolute: 2.2 10*3/uL (ref 1.4–7.0)
Neutrophils: 65 %
Platelets: 313 10*3/uL (ref 150–450)
RBC: 5.94 x10E6/uL — ABNORMAL HIGH (ref 3.77–5.28)
RDW: 19.7 % — ABNORMAL HIGH (ref 11.7–15.4)
WBC: 3.4 10*3/uL (ref 3.4–10.8)

## 2019-07-14 ENCOUNTER — Telehealth: Payer: Self-pay

## 2019-07-14 NOTE — Telephone Encounter (Signed)
Pt verified by name and DOB, results given per provider, pt voiced understanding all question answered. 

## 2020-01-15 ENCOUNTER — Encounter: Payer: Self-pay | Admitting: Family Medicine

## 2020-01-15 ENCOUNTER — Telehealth: Payer: Self-pay | Admitting: Family Medicine

## 2020-01-15 ENCOUNTER — Ambulatory Visit (INDEPENDENT_AMBULATORY_CARE_PROVIDER_SITE_OTHER): Payer: Medicare Other | Admitting: Family Medicine

## 2020-01-15 VITALS — BP 152/82 | HR 73 | Ht 69.0 in | Wt 206.0 lb

## 2020-01-15 DIAGNOSIS — Z79899 Other long term (current) drug therapy: Secondary | ICD-10-CM

## 2020-01-15 DIAGNOSIS — E559 Vitamin D deficiency, unspecified: Secondary | ICD-10-CM

## 2020-01-15 DIAGNOSIS — G35 Multiple sclerosis: Secondary | ICD-10-CM

## 2020-01-15 NOTE — Telephone Encounter (Signed)
[facility] medicare/medicaid order sent to [facility]. No auth they will reach out to the patient to schedule.  °

## 2020-01-15 NOTE — Patient Instructions (Signed)
Below is our plan:  We will continue Aubagio. I will update labs today. We will order MRI for monitoring.   Please make sure you are staying well hydrated. I recommend 50-60 ounces daily. Well balanced diet and regular exercise encouraged.    Please continue follow up with care team as directed.   Follow up with Dr Epimenio Foot in 6 months   You may receive a survey regarding today's visit. I encourage you to leave honest feed back as I do use this information to improve patient care. Thank you for seeing me today!      Multiple Sclerosis Multiple sclerosis (MS) is a disease of the brain, spinal cord, and optic nerves (central nervous system). It causes the body's disease-fighting (immune) system to destroy the protective covering (myelin sheath) around nerves in the brain. When this happens, signals (nerve impulses) going to and from the brain and spinal cord do not get sent properly or may not get sent at all. There are several types of MS:  Relapsing-remitting MS. This is the most common type. This causes sudden attacks of symptoms. After an attack, you may recover completely until the next attack, or some symptoms may remain permanently.  Secondary progressive MS. This usually develops after the onset of relapsing-remitting MS. Similar to relapsing-remitting MS, this type also causes sudden attacks of symptoms. Attacks may be less frequent, but symptoms slowly get worse (progress) over time.  Primary progressive MS. This causes symptoms that steadily progress over time. This type of MS does not cause sudden attacks of symptoms. The age of onset of MS varies, but it often develops between 15-80 years of age. MS is a lifelong (chronic) condition. There is no cure, but treatment can help slow down the progression of the disease. What are the causes? The cause of this condition is not known. What increases the risk? You are more likely to develop this condition if:  You are a woman.  You  have a relative with MS. However, the condition is not passed from parent to child (inherited).  You have a lack (deficiency) of vitamin D.  You smoke. MS is more common in the [place] than in the [place]. What are the signs or symptoms? Relapsing-remitting and secondary progressive MS cause symptoms to occur in episodes or attacks that may last weeks to months. There may be long periods between attacks in which there are almost no symptoms. Primary progressive MS causes symptoms to steadily progress after they develop. Symptoms of MS vary because of the many different ways it affects the central nervous system. The main symptoms include:  Vision problems and eye pain.  Numbness.  Weakness.  Inability to move your arms, hands, feet, or legs (paralysis).  Balance problems.  Shaking that you cannot control (tremors).  Muscle spasms.  Problems with thinking (cognitive changes). MS can also cause symptoms that are associated with the disease, but are not always the direct result of an MS attack. They may include:  Inability to control urination or bowel movements (incontinence).  Headaches.  Fatigue.  Inability to tolerate heat.  Emotional changes.  Depression.  Pain. How is this diagnosed? This condition is diagnosed based on:  Your symptoms.  A neurological exam. This involves checking central nervous system function, such as nerve function, reflexes, and coordination.  MRIs of the brain and spinal cord.  Lab tests, including a lumbar puncture that tests the fluid that surrounds the brain and spinal cord (cerebrospinal fluid).  Tests to measure the electrical activity of the brain in response to stimulation (evoked potentials). How is this treated? There is no cure for MS, but medicines can help decrease the number and frequency of attacks and help relieve nuisance symptoms. Treatment options may include:  Medicines that reduce the  frequency of attacks. These medicines may be given by injection, by mouth (orally), or through an IV.  Medicines that reduce inflammation (steroids). These may provide short-term relief of symptoms.  Medicines to help control pain, depression, fatigue, or incontinence.  Vitamin D, if you have a deficiency.  Using devices to help you move around (assistive devices), such as braces, a cane, or a walker.  Physical therapy to strengthen and stretch your muscles.  Occupational therapy to help you with everyday tasks.  Alternative or complementary treatments such as exercise, massage, or acupuncture. Follow these instructions at home:  Take over-the-counter and prescription medicines only as told by your health care provider.  Do not drive or use heavy machinery while taking prescription pain medicine.  Use assistive devices as recommended by your physical therapist or your health care provider.  Exercise as directed by your health care provider.  Return to your normal activities as told by your health care provider. Ask your health care provider what activities are safe for you.  Reach out for support. Share your feelings with friends, family, or a support group.  Keep all follow-up visits as told by your health care provider and therapists. This is important. Where to find more information  National Multiple Sclerosis Society: https://www.nationalmssociety.org Contact a health care provider if:  You feel depressed.  You develop new pain or numbness.  You have tremors.  You have problems with sexual function. Get help right away if:  You develop paralysis.  You develop numbness.  You have problems with your bladder or bowel function.  You develop double vision.  You lose vision in one or both eyes.  You develop suicidal thoughts.  You develop severe confusion. If you ever feel like you may hurt yourself or others, or have thoughts about taking your own life, get  help right away. You can go to your nearest emergency department or call:  Your local emergency services (911 in the [place]  A suicide crisis helpline, such as the National Suicide Prevention Lifeline at 951-233-3791. This is open 24 hours a day. Summary  Multiple sclerosis (MS) is a disease of the central nervous system that causes the body's immune system to destroy the protective covering (myelin sheath) around nerves in the brain.  There are 3 types of MS: relapsing-remitting, secondary progressive, and primary progressive. Relapsing-remitting and secondary progressive MS cause symptoms to occur in episodes or attacks that may last weeks to months. Primary progressive MS causes symptoms to steadily progress after they develop.  There is no cure for MS, but medicines can help decrease the number and frequency of attacks and help relieve nuisance symptoms. Treatment may also include physical or occupational therapy.  If you develop numbness, paralysis, vision problems, or other neurological symptoms, get help right away. This information is not intended to replace advice given to you by your health care provider. Make sure you discuss any questions you have with your health care provider. Document Revised: 02/16/2017 Document Reviewed: 05/15/2016 Elsevier Patient Education  2020 [employer].

## 2020-01-15 NOTE — Progress Notes (Signed)
Chief Complaint  Patient presents with  . Follow-up    rm 1  . Multiple Sclerosis     HISTORY OF PRESENT ILLNESS: Today 01/15/20  Margaret Henderson is a 54 y.o. female here today for follow up for RRMS. She continues Aubagio. Last MR brain 05/2017 was stable.   She continues to do well. She denies new or exacerbating symptoms. She continues to work. She is sleeping well. Mood is stable. No difficulty walking. She denies vision changes. She is due for eye exam.   PCP following BP. She reports it is usually well managed on amlodipine. She took meds just prior to visit. She continues vitamin D 2000iu daily.    HISTORY (copied from my note on 07/09/2019)  Margaret Henderson is a 54 y.o. female here today for follow up. She is taking Aubagio daily and tolerating well. She denies any changes since last being seen. She is feeling pretty good. No vision, gait, bowel or bladder changes.   She continues to work full time. She did lose her father in 04/2018 due to cancer, her brother to COVID in 11/2018 and reports that her mother is now being treated for dementia. She feels that she is holding up quite well considering. She does take amlodipine 5mg  at night for HTN. Her BP is elevated today but she feels this is due to white coat. She checks at home and readings are usually 120-140/70-80. She denies vision changes, headaches, chest pain or trouble breathing.     HISTORY: (copied from Dr note on 01/08/2019)  She feels her MS is stable. She is on Aubagio and tolerates it well. She takes qod as lymphocytes were low (they were lower on Tecfidera). She is walking well and balance is good. Her strength and sensation is good. Vision is fine. Bladder function is doing well.  She denies much fatigue and she sleeps well. Mood and cognition are doing well.    Update 05/21/2018: She is on Aubagio and tolerates it well. She switched from Tecfidera due to low lymphocyte counts and I  had her do qod Aubagio as lymphocytes were low at first. She has no exacerbations or new symptoms. She is exercising several times a week and has no problems with walking. She can do stairs. She feels vision is fine. Bladder function is fine. She denies much fatigue. Her father passed last week (prostate cancer to bone and liver) but she feels she is holding up well. She sleeps well some nights but if she wakes up she has trouble falling back asleep. She works day shifts only. She does not want any medication.   Update 11/02/2017: She is tolerating Aubagio well. She switched from Tecfidera due to low lymphocyte counts (0.5). No exacerbations or new symptoms last MRI was March 2019 and did not show any new lesions (she was on Tecfidera at the time). Gait, strength and sensation are fine. No spasticity or dysesthesia. She has some eye twitching. She denies change in vision. She has mild urinary frequency.   She reports only mild fatigue. She tries to stay out of the strong heat and does well most days. She is sleeping well. Mood and cognition are fine.  Update 07/02/2017: She was on Tecfidera for MS. Her lymphocyte count was low at 0.5 on 05/04/17 CBC and repeat value was even lower at 0.3 on 06/26/2017. Therefore, I asked her to come back in to discuss other treatment options as her risk of PML may be  higher with prolonged low lymphocyte counts on Tecfidera. Before Tecfidera, her only other medication was Betaseron but she broke through.   We went over several different options. She was most interested in staying on a pill. As her lymphocytes are low, I would prefer her to switch to Aubagio rather than Gilenya, siponimod or Cladribine. She has given some thought to pregnancy (she is in a second marriage) and we discuss that if she does get her tubal ligation reversed that around that time we would get her off of Aubagio with a washout protocol.    She denies any new MS symptoms since her last visit. She walks on a regular basis and tries to exercise daily. She denies any numbness or weakness.   Update 05/04/2017: She feels her MS is doing well. Specifically she has not had any exacerbations or new impairments. She is on Tecfidera and tolerates it well. She has had some borderline lymphocyte readings understands that if she gets a couple of low readings in a row we might consider a different disease modifying therapy.  She denies any significant impairments. Specifically there is no difficulty with her gait and she can easily walk a mile she had 2. There is no numbness or weakness. Bladder function is fine. Vision is fine. She has a little bit of fatigue now and then but no more than people she knows. It does not interfere with getting tasks done. She denies any depression. She sleeps well. Cognition is fine.  From 08/25/16: MS/transverse myelitis: In 2016, an MRI of the brain, low she was on Betaseron, showed some new lesions. Therefore, therapy was changed and she started [place] and 2016. She is tolerating it well. She started Tecfidera in 2016 and is tolerating it well. She never had any GI symptoms. Flushing was mild. She has not had any exacerbations. She is active and goes to the gym daily and walks 20 miles/week. An MRI of the brain 09/15/15 showed no lesions while on Tecfidera.   Gait/strength/sensation: Gait is doing well and she has no difficulty with balance. She has no difficulty climbing stairs. She can climb ladders. There is no numbness or weakness in the arms or legs. The numbness that occurred with transverse myelitis resolved. There is no problems with coordination.   Bladder: Bladder and bowel function is fine. She denies nocturia. No constipation.   Vision: She has not had any MS related difficulties with her vision. There is no history of optic neuritis or diplopia. No problems with color  vision  Mood/cognition: Mood is fine. She denies any depression or anxiety. There is no significant cognitive dysfunction.   Fatigue/sleep: She denies any significant problem with fatigue. She stays active and walks or exercises daily. She usually does not have heat intolerance. She feels that she sleeps well at night. She is getting 6-7 hours of sleep.  Vitamin D deficiency. She continues on supplementation. She takes 4000 U/day  MS History: She was diagnosed with multiple sclerosis in 2002 after presenting with numbness below her waist and clumsiness in the arms and legs. At that time, she was found to have an MRI consistent with multiple sclerosis. She also underwent a lumbar puncture and CSF was consistent with multiple sclerosis. She was started on Betaseron and has continued on Betaseron. Last year, an MRI of the brain showed 2 new foci not present in her earlier MRI. She had MRI's mid 2016 at [place]. . The brain and cervical spine were unchanged but her thoracic spine  showed a new focus compared to 2015.   Other: She has thalassemia and needs to take iron. Last HgB was 11.8.     REVIEW OF SYSTEMS: Out of a complete 14 system review of symptoms, the patient complains only of the following symptoms, none and all other reviewed systems are negative.   ALLERGIES: No Known Allergies   HOME MEDICATIONS: Outpatient Medications Prior to Visit  Medication Sig Dispense Refill  . amLODipine (NORVASC) 5 MG tablet Take 5 mg by mouth daily.  4  . Biotin 1000 MCG tablet Take 1,000 mcg by mouth 3 (three) times daily.    . cholecalciferol (VITAMIN D) 1000 UNITS tablet Take 2,000 Units by mouth 2 (two) times daily.    . ferrous sulfate 325 (65 FE) MG tablet Take 325 mg by mouth daily with breakfast.    . Teriflunomide (AUBAGIO) 14 MG TABS Take 14 mg by mouth daily. 90 tablet 3   No facility-administered medications prior to visit.     PAST MEDICAL HISTORY: Past  Medical History:  Diagnosis Date  . Hypertension   . Multiple sclerosis (HCC)   . Vision abnormalities      PAST SURGICAL HISTORY: Past Surgical History:  Procedure Laterality Date  . TUBAL LIGATION  1994     FAMILY HISTORY: Family History  Problem Relation Age of Onset  . Anemia Mother   . Diabetes Mother   . Hypertension Mother      SOCIAL HISTORY: Social History   Socioeconomic History  . Marital status: Single    Spouse name: Not on file  . Number of children: 2  . Years of education: Not on file  . Highest education level: Not on file  Occupational History  . Occupation: Medical records  Tobacco Use  . Smoking status: Never Smoker  . Smokeless tobacco: Never Used  Substance and Sexual Activity  . Alcohol use: No    Alcohol/week: 0.0 standard drinks  . Drug use: No  . Sexual activity: Not on file  Other Topics Concern  . Not on file  Social History Narrative   Right handed    Caffeine use: Tea sometimes.   Social Determinants of Health   Financial Resource Strain:   . Difficulty of Paying Living Expenses: Not on file  Food Insecurity:   . Worried About Programme researcher, broadcasting/film/video in the Last Year: Not on file  . Ran Out of Food in the Last Year: Not on file  Transportation Needs:   . Lack of Transportation (Medical): Not on file  . Lack of Transportation (Non-Medical): Not on file  Physical Activity:   . Days of Exercise per Week: Not on file  . Minutes of Exercise per Session: Not on file  Stress:   . Feeling of Stress : Not on file  Social Connections:   . Frequency of Communication with Friends and Family: Not on file  . Frequency of Social Gatherings with Friends and Family: Not on file  . Attends Religious Services: Not on file  . Active Member of Clubs or Organizations: Not on file  . Attends Banker Meetings: Not on file  . Marital Status: Not on file  Intimate Partner Violence:   . Fear of Current or Ex-Partner: Not on file  .  Emotionally Abused: Not on file  . Physically Abused: Not on file  . Sexually Abused: Not on file      PHYSICAL EXAM  Vitals:   01/15/20 0802  BP: (!) 152/82  Pulse: 73  Weight: 206 lb (93.4 kg)  Height: 5\' 9"  (1.753 m)   Body mass index is 30.42 kg/m.   Generalized: Well developed, in no acute distress   Neurological examination  Mentation: Alert oriented to time, place, history taking. Follows all commands speech and language fluent Cranial nerve II-XII: Pupils were equal round reactive to light. Extraocular movements were full, visual field were full on confrontational test. Facial sensation and strength were normal. Uvula tongue midline. Head turning and shoulder shrug  were normal and symmetric. Motor: The motor testing reveals 5 over 5 strength of all 4 extremities. Good symmetric motor tone is noted throughout.  Sensory: Sensory testing is intact to soft touch on all 4 extremities. No evidence of extinction is noted.  Coordination: Cerebellar testing reveals good finger-nose-finger and heel-to-shin bilaterally.  Gait and station: Gait is normal.  Reflexes: Deep tendon reflexes are symmetric and normal bilaterally.     DIAGNOSTIC DATA (LABS, IMAGING, TESTING) - I reviewed patient records, labs, notes, testing and imaging myself where available.  Lab Results  Component Value Date   WBC 3.4 07/10/2019   HGB 13.0 07/10/2019   HCT 42.1 07/10/2019   MCV 71 (L) 07/10/2019   PLT 313 07/10/2019      Component Value Date/Time   NA 139 07/10/2019 0844   K 4.1 07/10/2019 0844   CL 101 07/10/2019 0844   CO2 25 07/10/2019 0844   GLUCOSE 104 (H) 07/10/2019 0844   BUN 9 07/10/2019 0844   CREATININE 0.66 07/10/2019 0844   CALCIUM 10.2 07/10/2019 0844   PROT 7.4 07/10/2019 0844   ALBUMIN 4.2 07/10/2019 0844   AST 23 07/10/2019 0844   ALT 20 07/10/2019 0844   ALKPHOS 101 07/10/2019 0844   BILITOT <0.2 07/10/2019 0844   GFRNONAA 103 07/10/2019 0844   GFRAA 118  07/10/2019 0844   No results found for: CHOL, HDL, LDLCALC, LDLDIRECT, TRIG, CHOLHDL No results found for: PTWS5K No results found for: VITAMINB12 No results found for: TSH    ASSESSMENT AND PLAN  54 y.o. year old female  has a past medical history of Hypertension, Multiple sclerosis (HCC), and Vision abnormalities. here with   Multiple sclerosis (HCC) - Plan: CBC with Differential/Platelets, CMP, MR BRAIN W WO CONTRAST  High risk medication use  Vitamin D deficiency  We will continue Aubagio as prescribed. We will update labs and MRI. She was encouraged to continue healthy lifestyle habits. She will continue vitamin D OTC. Regular follow up with PCP for HTN management. She will return to see Dr Epimenio Foot in 6 months.   I spent 20 minutes of face-to-face and non-face-to-face time with patient.  This included previsit chart review, lab review, study review, order entry, electronic health record documentation, patient education.    Shawnie Dapper, MSN, FNP-C 01/15/2020, 8:19 AM  [facility] [address], [address] [place], [place] [address] [phone]

## 2020-01-16 LAB — CBC WITH DIFFERENTIAL/PLATELET
Basophils Absolute: 0 10*3/uL (ref 0.0–0.2)
Basos: 1 %
EOS (ABSOLUTE): 0.1 10*3/uL (ref 0.0–0.4)
Eos: 4 %
Hematocrit: 35.4 % (ref 34.0–46.6)
Hemoglobin: 11.4 g/dL (ref 11.1–15.9)
Immature Grans (Abs): 0 10*3/uL (ref 0.0–0.1)
Immature Granulocytes: 0 %
Lymphocytes Absolute: 0.7 10*3/uL (ref 0.7–3.1)
Lymphs: 18 %
MCH: 23.4 pg — ABNORMAL LOW (ref 26.6–33.0)
MCHC: 32.2 g/dL (ref 31.5–35.7)
MCV: 73 fL — ABNORMAL LOW (ref 79–97)
Monocytes Absolute: 0.5 10*3/uL (ref 0.1–0.9)
Monocytes: 14 %
Neutrophils Absolute: 2.5 10*3/uL (ref 1.4–7.0)
Neutrophils: 63 %
Platelets: 291 10*3/uL (ref 150–450)
RBC: 4.87 x10E6/uL (ref 3.77–5.28)
RDW: 18.6 % — ABNORMAL HIGH (ref 11.7–15.4)
WBC: 3.9 10*3/uL (ref 3.4–10.8)

## 2020-01-16 LAB — COMPREHENSIVE METABOLIC PANEL
ALT: 19 IU/L (ref 0–32)
AST: 17 IU/L (ref 0–40)
Albumin/Globulin Ratio: 1.6 (ref 1.2–2.2)
Albumin: 4.2 g/dL (ref 3.8–4.9)
Alkaline Phosphatase: 85 IU/L (ref 44–121)
BUN/Creatinine Ratio: 10 (ref 9–23)
BUN: 7 mg/dL (ref 6–24)
Bilirubin Total: 0.3 mg/dL (ref 0.0–1.2)
CO2: 26 mmol/L (ref 20–29)
Calcium: 10 mg/dL (ref 8.7–10.2)
Chloride: 103 mmol/L (ref 96–106)
Creatinine, Ser: 0.69 mg/dL (ref 0.57–1.00)
GFR calc Af Amer: 116 mL/min/{1.73_m2} (ref >59)
GFR calc non Af Amer: 100 mL/min/{1.73_m2} (ref >59)
Globulin, Total: 2.6 g/dL (ref 1.5–4.5)
Glucose: 83 mg/dL (ref 65–99)
Potassium: 4.4 mmol/L (ref 3.5–5.2)
Sodium: 139 mmol/L (ref 134–144)
Total Protein: 6.8 g/dL (ref 6.0–8.5)

## 2020-01-19 ENCOUNTER — Telehealth: Payer: Self-pay

## 2020-01-19 NOTE — Telephone Encounter (Signed)
-----   Message from Shawnie Dapper, NP sent at 01/19/2020 12:50 PM EDT ----- Labs are stable. Continue current treatment plan.

## 2020-01-19 NOTE — Telephone Encounter (Signed)
Margaret Henderson is a 54 y.o. female was contacted and notified of the message below. Pt has no additional questions or concerns.

## 2020-01-22 ENCOUNTER — Ambulatory Visit
Admission: RE | Admit: 2020-01-22 | Discharge: 2020-01-22 | Disposition: A | Discharge status: Home / Self Care | Payer: Medicare Other | Source: Ambulatory Visit | Attending: Family Medicine | Admitting: Family Medicine

## 2020-01-22 DIAGNOSIS — G35 Multiple sclerosis: Secondary | ICD-10-CM

## 2020-01-22 MED ORDER — GADOBENATE DIMEGLUMINE 529 MG/ML IV SOLN
20.0000 mL | Freq: Once | INTRAVENOUS | Status: AC | PRN
  Administered 2020-01-22: 20 mL via INTRAVENOUS

## 2020-01-26 ENCOUNTER — Telehealth: Payer: Self-pay

## 2020-01-26 NOTE — Telephone Encounter (Signed)
Attempted to call the patient without success.  LM on the VM for the patient to call back re: recent results.  **If the patient calls back please relay the message below from Amy Lomax NP 

## 2020-01-26 NOTE — Telephone Encounter (Signed)
-----   Message from Shawnie Dapper, NP sent at 01/26/2020  7:32 AM EST ----- MRI is stable with no new lesions. We will continue current treatment plan.

## 2020-01-30 ENCOUNTER — Other Ambulatory Visit: Payer: Medicare Other

## 2020-02-15 ENCOUNTER — Other Ambulatory Visit: Payer: Self-pay | Admitting: Neurology

## 2020-02-15 DIAGNOSIS — G35 Multiple sclerosis: Secondary | ICD-10-CM

## 2020-03-26 ENCOUNTER — Telehealth: Payer: Self-pay | Admitting: Family Medicine

## 2020-03-26 NOTE — Telephone Encounter (Signed)
Pt. states her PCP told her to let her MS Dr. know that she has tested positive for Covid. Pt. states she got her results this morning & is not really having symptoms.

## 2020-07-21 ENCOUNTER — Encounter: Payer: Self-pay | Admitting: Neurology

## 2020-07-21 ENCOUNTER — Ambulatory Visit (INDEPENDENT_AMBULATORY_CARE_PROVIDER_SITE_OTHER): Payer: Medicare Other | Admitting: Neurology

## 2020-07-21 ENCOUNTER — Other Ambulatory Visit: Payer: Self-pay

## 2020-07-21 VITALS — BP 149/82 | HR 69 | Ht 69.0 in | Wt 213.0 lb

## 2020-07-21 DIAGNOSIS — G35 Multiple sclerosis: Secondary | ICD-10-CM

## 2020-07-21 DIAGNOSIS — D72810 Lymphocytopenia: Secondary | ICD-10-CM

## 2020-07-21 DIAGNOSIS — E559 Vitamin D deficiency, unspecified: Secondary | ICD-10-CM | POA: Diagnosis not present

## 2020-07-21 DIAGNOSIS — R269 Unspecified abnormalities of gait and mobility: Secondary | ICD-10-CM

## 2020-07-21 DIAGNOSIS — Z79899 Other long term (current) drug therapy: Secondary | ICD-10-CM

## 2020-07-21 NOTE — Progress Notes (Signed)
[facility]  PATIENT: Margaret Henderson DOB: [birth year]  REFERRING DOCTOR OR PCP:  Dr. Lonie Peak [facility]), Neuro:  [facility]igham SOURCE: paitent and records form [facility]  _________________________________   HISTORICAL  CHIEF COMPLAINT:  Chief Complaint  Patient presents with  . Follow-up    RM 12, alone. Last seen 01/15/2020 by AL,NP. On Aubagio for MS. Had covid around 03/26/20. Doing well, no new sx.    HISTORY OF PRESENT ILLNESS:  Margaret Henderson is a 55 y.o. woman with RRMS.     Update 01/08/2019: She feels her MS is stable.   She has no exacerbation or new neurologic function.  Her last exacerbation was > 10-15 years ago   She did Solu-medrol 3 times many years ago.     She is on Aubagio and tolerates it well.  Lymphocytes have been low. (they were lower on Tecfidera).  Last CBC/D was 01/15/2020 - lymphocytes low normal at 0.7.  For a while, I had her change the Aubagio to every other day and we increase it back to daily when the lymphocytes returned to normal.  She is walking well and balance is good.  She works out and could easily walk a few miles.   She goes downstairs without using the bannister.    She denies numbness or tingling.    Vision is fine.   Bladder function is doing well.  She has mild fatigue but notes having to do more and being very busy.   She sleeps well.   Mood and cognition are doing well.   She has some stress with having to put mom in nursing home.   She sees her nearly every day.     MS history:     She was diagnosed with multiple sclerosis in 2002 after presenting with numbness below her waist and clumsiness in the arms and legs. At that time, she was found to have an MRI consistent with multiple sclerosis. She also underwent a lumbar puncture and CSF was  consistent with multiple sclerosis. She was started on Betaseron. Last year, an MRI of the brain showed 2 new foci not present in her earlier MRI. She had MRI's mid 2016 at  [place]. . The brain and cervical spine were unchanged but her thoracic spine showed a new focus compared to 2015.  She switched to Tecfidera in 2016 to change to an oral agent but lymphocytes were low.  She changed to Aubagio and for a while was on every other day due to low lymphocytes.  Other:   She has thalassemia and needs to take iron.    Last HgB was 11.8.     Imaging: MRI brain 01/22/2020 showed T2/flair hyperintense foci predominantly in the periventricular white matter consistent with chronic demyelinating plaque associated with multiple sclerosis.  None of the foci appear to be acute.  They do not enhance.  Compared to the MRI dated 05/18/2017, there are no new lesions..   There is a normal enhancement pattern and no acute findings.  REVIEW OF SYSTEMS: Constitutional: No fevers, chills, sweats, or change in appetite.   Rare fatigue Eyes: No visual changes, double vision, eye pain Ear, nose and throat: No hearing loss, ear pain, nasal congestion, sore throat Cardiovascular: No chest pain, palpitations Respiratory: No shortness of breath at rest or with exertion.   No wheezes GastrointestinaI: No nausea, vomiting, diarrhea, abdominal pain, fecal incontinence Genitourinary: No dysuria, urinary retention or frequency.  No nocturia. Musculoskeletal: No neck pain,  back pain Integumentary: No rash, pruritus, skin lesions Neurological: as above Psychiatric: No depression at this time.  No anxiety Endocrine: No palpitations, diaphoresis, change in appetite, change in weigh or increased thirst Hematologic/Lymphatic: No anemia, purpura, petechiae. Allergic/Immunologic: No itchy/runny eyes, nasal congestion, recent allergic reactions, rashes  ALLERGIES: No Known Allergies  HOME MEDICATIONS:  Current Outpatient Medications:  .  amLODipine (NORVASC) 5 MG tablet, Take 5 mg by mouth daily., Disp: , Rfl: 4 .  AUBAGIO 14 MG TABS, TAKE 1 TABLET BY MOUTH  DAILY, Disp: 90 tablet, Rfl: 3 .   Biotin 1000 MCG tablet, Take 1,000 mcg by mouth 3 (three) times daily., Disp: , Rfl:  .  cholecalciferol (VITAMIN D) 1000 UNITS tablet, Take 2,000 Units by mouth 2 (two) times daily., Disp: , Rfl:  .  Cyanocobalamin (B-12 PO), Take 500 mcg by mouth daily., Disp: , Rfl:  .  ferrous sulfate 325 (65 FE) MG tablet, Take 325 mg by mouth daily with breakfast., Disp: , Rfl:   PAST MEDICAL HISTORY: Past Medical History:  Diagnosis Date  . Hypertension   . Multiple sclerosis (HCC)   . Vision abnormalities     PAST SURGICAL HISTORY: Past Surgical History:  Procedure Laterality Date  . TUBAL LIGATION  1994    FAMILY HISTORY: Family History  Problem Relation Age of Onset  . Anemia Mother   . Diabetes Mother   . Hypertension Mother     SOCIAL HISTORY:  Social History   Socioeconomic History  . Marital status: Single    Spouse name: Not on file  . Number of children: 2  . Years of education: Not on file  . Highest education level: Not on file  Occupational History  . Occupation: Medical records  Tobacco Use  . Smoking status: Never Smoker  . Smokeless tobacco: Never Used  Substance and Sexual Activity  . Alcohol use: No    Alcohol/week: 0.0 standard drinks  . Drug use: No  . Sexual activity: Not on file  Other Topics Concern  . Not on file  Social History Narrative   Right handed    Caffeine use: Tea sometimes.   Social Determinants of Health   Financial Resource Strain: Not on file  Food Insecurity: Not on file  Transportation Needs: Not on file  Physical Activity: Not on file  Stress: Not on file  Social Connections: Not on file  Intimate Partner Violence: Not on file     PHYSICAL EXAM  Vitals:   07/21/20 0818  BP: (!) 149/82  Pulse: 69  Weight: 213 lb (96.6 kg)  Height: 5\' 9"  (1.753 m)    Body mass index is 31.45 kg/m.   General: The patient is well-developed and well-nourished and in no acute distress   Neurologic Exam  Mental status: The  patient is alert and oriented x 3 at the time of the examination. The patient has apparent normal recent and remote memory, with an apparently normal attention span and concentration ability.   Speech is normal.  Cranial nerves: Extraocular movements are full.  Color vision was normal and symmetric.  Facial strength and sensation was normal.  Trapezius strength is strong.. No obvious hearing deficits are noted.  Motor:  Muscle bulk is normal.   Muscle tone is normal. Strength is 5/5.   Sensory: Sensory testing showed normal sensation to temperature and vibration sensation in the arms and legs  Coordination: Finger-nose-finger is normal in both arms.  Heel-to-shin normal in both arms.  Gait and  station: Station is normal.  Gait is normal.  Tandem gait is minimally wide..  Romberg is negative.  Reflexes: Deep tendon reflexes are symmetric and normal bilaterally.       ASSESSMENT AND PLAN  Multiple sclerosis (HCC) - Plan: CBC with Differential/Platelet, Hepatic function panel  High risk medication use - Plan: CBC with Differential/Platelet, Hepatic function panel  Vitamin D deficiency  Gait disturbance  Lymphocytopenia   1.   Continue Aubagio 14 mg qd. Check CBC with differential and Hep panel.   If lymphocytes are low again we will consider going back to 14 mg every other day 2.  Continue vitamin D supplements. 3.   Stay active and exercise.    She exercises daily. Return to clinic 6 months or sooner if there are new or worsening neurologic symptoms.   Margaret Henderson A. Epimenio Foot, MD, PhD, [place]    07/21/20   8:58 AM Certified in Neurology, Clinical Neurophysiology, Sleep Medicine, Pain Medicine and Neuroimaging Director, [facility] at [facility]  [facility] [address] [place], [place] [address] [phone]

## 2020-07-22 LAB — CBC WITH DIFFERENTIAL/PLATELET
Basophils Absolute: 0 10*3/uL (ref 0.0–0.2)
Basos: 1 %
EOS (ABSOLUTE): 0.2 10*3/uL (ref 0.0–0.4)
Eos: 4 %
Hematocrit: 39.2 % (ref 34.0–46.6)
Hemoglobin: 12.1 g/dL (ref 11.1–15.9)
Immature Grans (Abs): 0 10*3/uL (ref 0.0–0.1)
Immature Granulocytes: 0 %
Lymphocytes Absolute: 0.9 10*3/uL (ref 0.7–3.1)
Lymphs: 23 %
MCH: 23.5 pg — ABNORMAL LOW (ref 26.6–33.0)
MCHC: 30.9 g/dL — ABNORMAL LOW (ref 31.5–35.7)
MCV: 76 fL — ABNORMAL LOW (ref 79–97)
Monocytes Absolute: 0.6 10*3/uL (ref 0.1–0.9)
Monocytes: 14 %
Neutrophils Absolute: 2.2 10*3/uL (ref 1.4–7.0)
Neutrophils: 58 %
Platelets: 298 10*3/uL (ref 150–450)
RBC: 5.14 x10E6/uL (ref 3.77–5.28)
RDW: 16.2 % — ABNORMAL HIGH (ref 11.7–15.4)
WBC: 3.8 10*3/uL (ref 3.4–10.8)

## 2020-07-22 LAB — HEPATIC FUNCTION PANEL
ALT: 19 IU/L (ref 0–32)
AST: 20 IU/L (ref 0–40)
Albumin: 4.2 g/dL (ref 3.8–4.9)
Alkaline Phosphatase: 96 IU/L (ref 44–121)
Bilirubin Total: 0.3 mg/dL (ref 0.0–1.2)
Bilirubin, Direct: <0.1 mg/dL (ref 0.00–0.40)
Total Protein: 7 g/dL (ref 6.0–8.5)

## 2020-11-11 ENCOUNTER — Other Ambulatory Visit: Payer: Self-pay | Admitting: *Deleted

## 2020-11-11 DIAGNOSIS — G35 Multiple sclerosis: Secondary | ICD-10-CM

## 2020-11-11 MED ORDER — AUBAGIO 14 MG PO TABS: 1.0000 | ORAL_TABLET | Freq: Every day | ORAL | 3 refills | Status: DC

## 2021-01-24 NOTE — Patient Instructions (Incomplete)

## 2021-01-24 NOTE — Progress Notes (Deleted)
No chief complaint on file.    HISTORY OF PRESENT ILLNESS: 01/24/21 ALL: Margaret Henderson is a 55 y.o. female here today for follow up for RRMS. She continues Aubagio. Last MR brain 05/2017 was stable.   She continues to do well. She denies new or exacerbating symptoms. She continues to work. She is sleeping well. Mood is stable. No difficulty walking. She denies vision changes. She is due for eye exam.   PCP following BP. She reports it is usually well managed on amlodipine. She took meds just prior to visit. She continues vitamin D 2000iu daily.   HISTORY: (copied from Dr Bonnita Hollow note)   Margaret Henderson is a 55 y.o. woman with RRMS.      Update 01/08/2019: She feels her MS is stable.   She has no exacerbation or new neurologic function.  Her last exacerbation was > 10-15 years ago   She did Solu-medrol 3 times many years ago.     She is on Aubagio and tolerates it well.  Lymphocytes have been low. (they were lower on Tecfidera).  Last CBC/D was 01/15/2020 - lymphocytes low normal at 0.7.  For a while, I had her change the Aubagio to every other day and we increase it back to daily when the lymphocytes returned to normal.   She is walking well and balance is good.  She works out and could easily walk a few miles.   She goes downstairs without using the bannister.    She denies numbness or tingling.    Vision is fine.   Bladder function is doing well.   She has mild fatigue but notes having to do more and being very busy.   She sleeps well.   Mood and cognition are doing well.   She has some stress with having to put mom in nursing home.   She sees her nearly every day.      MS history:     She was diagnosed with multiple sclerosis in 2002 after presenting with numbness below her waist and clumsiness in the arms and legs. At that time, she was found to have an MRI consistent with multiple sclerosis. She also underwent a lumbar puncture and CSF was  consistent with multiple sclerosis. She was  started on Betaseron. Last year, an MRI of the brain showed 2 new foci not present in her earlier MRI. She had MRI's mid 2016 at [place]. . The brain and cervical spine were unchanged but her thoracic spine showed a new focus compared to 2015.  She switched to Tecfidera in 2016 to change to an oral agent but lymphocytes were low.  She changed to Aubagio and for a while was on every other day due to low lymphocytes.   Other:   She has thalassemia and needs to take iron.    Last HgB was 11.8.      Imaging: MRI brain 01/22/2020 showed T2/flair hyperintense foci predominantly in the periventricular white matter consistent with chronic demyelinating plaque associated with multiple sclerosis.  None of the foci appear to be acute.  They do not enhance.  Compared to the MRI dated 05/18/2017, there are no new lesions..   There is a normal enhancement pattern and no acute findings.  REVIEW OF SYSTEMS: Out of a complete 14 system review of symptoms, the patient complains only of the following symptoms, none and all other reviewed systems are negative.   ALLERGIES: No Known Allergies   HOME MEDICATIONS: Outpatient Medications Prior  to Visit  Medication Sig Dispense Refill   amLODipine (NORVASC) 5 MG tablet Take 5 mg by mouth daily.  4   Biotin 1000 MCG tablet Take 1,000 mcg by mouth 3 (three) times daily.     cholecalciferol (VITAMIN D) 1000 UNITS tablet Take 2,000 Units by mouth 2 (two) times daily.     Cyanocobalamin (B-12 PO) Take 500 mcg by mouth daily.     ferrous sulfate 325 (65 FE) MG tablet Take 325 mg by mouth daily with breakfast.     Teriflunomide (AUBAGIO) 14 MG TABS Take 1 tablet by mouth daily. 90 tablet 3   No facility-administered medications prior to visit.     PAST MEDICAL HISTORY: Past Medical History:  Diagnosis Date   Hypertension    Multiple sclerosis (HCC)    Vision abnormalities      PAST SURGICAL HISTORY: Past Surgical History:  Procedure Laterality Date   TUBAL  LIGATION  1994     FAMILY HISTORY: Family History  Problem Relation Age of Onset   Anemia Mother    Diabetes Mother    Hypertension Mother      SOCIAL HISTORY: Social History   Socioeconomic History   Marital status: Single    Spouse name: Not on file   Number of children: 2   Years of education: Not on file   Highest education level: Not on file  Occupational History   Occupation: Medical records  Tobacco Use   Smoking status: Never   Smokeless tobacco: Never  Substance and Sexual Activity   Alcohol use: No    Alcohol/week: 0.0 standard drinks   Drug use: No   Sexual activity: Not on file  Other Topics Concern   Not on file  Social History Narrative   Right handed    Caffeine use: Tea sometimes.   Social Determinants of Health   Financial Resource Strain: Not on file  Food Insecurity: Not on file  Transportation Needs: Not on file  Physical Activity: Not on file  Stress: Not on file  Social Connections: Not on file  Intimate Partner Violence: Not on file      PHYSICAL EXAM  There were no vitals filed for this visit.  There is no height or weight on file to calculate BMI.   Generalized: Well developed, in no acute distress   Neurological examination  Mentation: Alert oriented to time, place, history taking. Follows all commands speech and language fluent Cranial nerve II-XII: Pupils were equal round reactive to light. Extraocular movements were full, visual field were full on confrontational test. Facial sensation and strength were normal. Uvula tongue midline. Head turning and shoulder shrug  were normal and symmetric. Motor: The motor testing reveals 5 over 5 strength of all 4 extremities. Good symmetric motor tone is noted throughout.  Sensory: Sensory testing is intact to soft touch on all 4 extremities. No evidence of extinction is noted.  Coordination: Cerebellar testing reveals good finger-nose-finger and heel-to-shin bilaterally.  Gait and  station: Gait is normal.  Reflexes: Deep tendon reflexes are symmetric and normal bilaterally.     DIAGNOSTIC DATA (LABS, IMAGING, TESTING) - I reviewed patient records, labs, notes, testing and imaging myself where available.  Lab Results  Component Value Date   WBC 3.8 07/21/2020   HGB 12.1 07/21/2020   HCT 39.2 07/21/2020   MCV 76 (L) 07/21/2020   PLT 298 07/21/2020      Component Value Date/Time   NA 139 01/15/2020 0827   K 4.4  01/15/2020 0827   CL 103 01/15/2020 0827   CO2 26 01/15/2020 0827   GLUCOSE 83 01/15/2020 0827   BUN 7 01/15/2020 0827   CREATININE 0.69 01/15/2020 0827   CALCIUM 10.0 01/15/2020 0827   PROT 7.0 07/21/2020 0903   ALBUMIN 4.2 07/21/2020 0903   AST 20 07/21/2020 0903   ALT 19 07/21/2020 0903   ALKPHOS 96 07/21/2020 0903   BILITOT 0.3 07/21/2020 0903   GFRNONAA 100 01/15/2020 0827   GFRAA 116 01/15/2020 0827   No results found for: CHOL, HDL, LDLCALC, LDLDIRECT, TRIG, CHOLHDL No results found for: NFAO1H No results found for: VITAMINB12 No results found for: TSH    ASSESSMENT AND PLAN  55 y.o. year old female  has a past medical history of Hypertension, Multiple sclerosis (HCC), and Vision abnormalities. here with   No diagnosis found.  We will continue Aubagio as prescribed. We will update labs and MRI. She was encouraged to continue healthy lifestyle habits. She will continue vitamin D OTC. Regular follow up with PCP for HTN management. She will return to see Dr Epimenio Foot in 6 months.    Shawnie Dapper, MSN, FNP-C 01/24/2021, 12:52 PM  [facility] [address], [address] [place], [place] [address] [phone]

## 2021-01-26 ENCOUNTER — Ambulatory Visit: Payer: Medicare Other | Admitting: Family Medicine

## 2021-01-26 DIAGNOSIS — G35 Multiple sclerosis: Secondary | ICD-10-CM

## 2021-01-26 DIAGNOSIS — E559 Vitamin D deficiency, unspecified: Secondary | ICD-10-CM

## 2021-01-26 DIAGNOSIS — Z79899 Other long term (current) drug therapy: Secondary | ICD-10-CM

## 2021-02-15 ENCOUNTER — Other Ambulatory Visit: Payer: Self-pay

## 2021-02-15 ENCOUNTER — Ambulatory Visit (INDEPENDENT_AMBULATORY_CARE_PROVIDER_SITE_OTHER): Payer: Medicare Other | Admitting: Neurology

## 2021-02-15 ENCOUNTER — Encounter: Payer: Self-pay | Admitting: Neurology

## 2021-02-15 VITALS — BP 145/76 | HR 70 | Ht 69.0 in | Wt 206.0 lb

## 2021-02-15 DIAGNOSIS — R269 Unspecified abnormalities of gait and mobility: Secondary | ICD-10-CM | POA: Diagnosis not present

## 2021-02-15 DIAGNOSIS — D569 Thalassemia, unspecified: Secondary | ICD-10-CM | POA: Diagnosis not present

## 2021-02-15 DIAGNOSIS — G35 Multiple sclerosis: Secondary | ICD-10-CM

## 2021-02-15 DIAGNOSIS — Z79899 Other long term (current) drug therapy: Secondary | ICD-10-CM

## 2021-02-15 NOTE — Progress Notes (Signed)
[facility]  PATIENT: Margaret Henderson DOB: 23-Apr-1965  REFERRING DOCTOR OR PCP:  Dr. Lonie Peak [facility]), Neuro:  L[facility] Brigham SOURCE: paitent and records form [facility]  _________________________________   HISTORICAL  CHIEF COMPLAINT:  Chief Complaint  Patient presents with   Follow-up    Rm 2, alone. Here for 6 month MS f/u, on Aubagio. Pt reports doing well. No new or worsening in sx.     HISTORY OF PRESENT ILLNESS:  Margaret Henderson is a 55 y.o. woman with RRMS.     She was diagnsed in 2002 with a spina cord syndrome.  Update 02/15/21 She feels her MS is stable.   She is on Aubagio and tolerates it well.   She has no exacerbation or new neurologic function.  Her last exacerbation was > 10-15 years ago   She did Solu-medrol 3 times many years ago.      Lymphocytes have been low. (they were lower on Tecfidera).  Last CBC/D was 01/15/2020 - lymphocytes low normal at 0.7.  For a while, I had her change the Aubagio to every other day and we increase it back to daily when the lymphocytes returned to normal.  She has thalassemia and takes iron.   She has reduced MCV but good Hgb.  Her mother has anemia too.    She is walking well and balance is good.  No falls.  She works out and could easily walk a few miles.   She goes downstairs without using the bannister.    She denies numbness or tingling (though arms and legs go asleep easily but movements help after a couple minutes).    Vision is fine.   Bladder function is doing well.  She has mild fatigue but notes having to do more and being very busy.   She sleeps well.   Mood and cognition are doing well.   She notes some stress with her mom needing to go to a nursing home.   She is the only living child.  She sees her most days..     MS history:     She was diagnosed with multiple sclerosis in 2002 after presenting with numbness below her waist and clumsiness in the arms and legs. At that time, she was found to  have an MRI consistent with multiple sclerosis. She also underwent a lumbar puncture and CSF was  consistent with multiple sclerosis. She was started on Betaseron. Last year, an MRI of the brain showed 2 new foci not present in her earlier MRI. She had MRI's mid 2016 at [place]. . The brain and cervical spine were unchanged but her thoracic spine showed a new focus compared to 2015.  She switched to Tecfidera in 2016 to change to an oral agent but lymphocytes were low.  She changed to Aubagio and for a while was on every other day due to low lymphocytes.  Other:   She has thalassemia and needs to take iron.    Last HgB was 11.8.     Imaging: MRI brain 01/22/2020 showed T2/flair hyperintense foci predominantly in the periventricular white matter consistent with chronic demyelinating plaque associated with multiple sclerosis.  None of the foci appear to be acute.  They do not enhance.  Compared to the MRI dated 05/18/2017, there are no new lesions..   There is a normal enhancement pattern and no acute findings.    REVIEW OF SYSTEMS: Constitutional: No fevers, chills, sweats, or change in appetite.  Rare fatigue Eyes: No visual changes, double vision, eye pain Ear, nose and throat: No hearing loss, ear pain, nasal congestion, sore throat Cardiovascular: No chest pain, palpitations Respiratory:  No shortness of breath at rest or with exertion.   No wheezes GastrointestinaI: No nausea, vomiting, diarrhea, abdominal pain, fecal incontinence Genitourinary:  No dysuria, urinary retention or frequency.  No nocturia. Musculoskeletal:  No neck pain, back pain Integumentary: No rash, pruritus, skin lesions Neurological: as above Psychiatric: No depression at this time.  No anxiety Endocrine: No palpitations, diaphoresis, change in appetite, change in weigh or increased thirst Hematologic/Lymphatic:  No anemia, purpura, petechiae. Allergic/Immunologic: No itchy/runny eyes, nasal congestion, recent allergic  reactions, rashes  ALLERGIES: No Known Allergies  HOME MEDICATIONS:  Current Outpatient Medications:    amLODipine (NORVASC) 5 MG tablet, Take 5 mg by mouth daily., Disp: , Rfl: 4   Biotin 1000 MCG tablet, Take 1,000 mcg by mouth 3 (three) times daily., Disp: , Rfl:    cholecalciferol (VITAMIN D) 1000 UNITS tablet, Take 2,000 Units by mouth 2 (two) times daily., Disp: , Rfl:    Cyanocobalamin (B-12 PO), Take 500 mcg by mouth daily., Disp: , Rfl:    ferrous sulfate 325 (65 FE) MG tablet, Take 325 mg by mouth daily with breakfast., Disp: , Rfl:    Teriflunomide (AUBAGIO) 14 MG TABS, Take 1 tablet by mouth daily., Disp: 90 tablet, Rfl: 3  PAST MEDICAL HISTORY: Past Medical History:  Diagnosis Date   Hypertension    Multiple sclerosis (HCC)    Vision abnormalities     PAST SURGICAL HISTORY: Past Surgical History:  Procedure Laterality Date   TUBAL LIGATION  1994    FAMILY HISTORY: Family History  Problem Relation Age of Onset   Anemia Mother    Diabetes Mother    Hypertension Mother     SOCIAL HISTORY:  Social History   Socioeconomic History   Marital status: Single    Spouse name: Not on file   Number of children: 2   Years of education: Not on file   Highest education level: Not on file  Occupational History   Occupation: Medical records  Tobacco Use   Smoking status: Never   Smokeless tobacco: Never  Substance and Sexual Activity   Alcohol use: No    Alcohol/week: 0.0 standard drinks   Drug use: No   Sexual activity: Not on file  Other Topics Concern   Not on file  Social History Narrative   Right handed    Caffeine use: Tea sometimes.   Social Determinants of Health   Financial Resource Strain: Not on file  Food Insecurity: Not on file  Transportation Needs: Not on file  Physical Activity: Not on file  Stress: Not on file  Social Connections: Not on file  Intimate Partner Violence: Not on file     PHYSICAL EXAM  Vitals:   02/15/21 1307   BP: (!) 145/76  Pulse: 70  Weight: 206 lb (93.4 kg)  Height: 5\' 9"  (1.753 m)    Body mass index is 30.42 kg/m.   General: The patient is well-developed and well-nourished and in no acute distress   Neurologic Exam  Mental status: The patient is alert and oriented x 3 at the time of the examination. The patient has apparent normal recent and remote memory, with an apparently normal attention span and concentration ability.   Speech is normal.  Cranial nerves: Extraocular movements are full.  Color vision was normal and symmetric.  Facial  strength and sensation was normal.  Trapezius strength is strong.. No obvious hearing deficits are noted.  Motor:  Muscle bulk is normal.   Muscle tone is normal. Strength is 5/5.   Sensory: Sensory testing showed normal sensation to temperature and vibration sensation in the arms and legs  Coordination: Finger-nose-finger is normal in both arms.  Heel-to-shin normal in both arms.  Gait and station: Station is normal.  She has a normal gait but the tandem gait is minimally wide.  She has no Romberg sign  Reflexes: Deep tendon reflexes are symmetric and normal bilaterally.       ASSESSMENT AND PLAN  Multiple sclerosis (HCC)  Gait disturbance  High risk medication use  Thalassemia, unspecified type   1.   She will continue Aubagio 14 mg qd.  Lymphocytes were fine when last checked.  Liver function test is normal.     2.  Continue vitamin D supplements. 3.   Stay active and exercise.    She exercises daily. 4.   Around the time of her next visit she will need to have blood work and an MRI to determine if there is subclinical breakthrough.  If this is occurring we will need to to consider a different disease modifying therapy. Return to clinic 6 months or sooner if there are new or worsening neurologic symptoms.   Lance Huaracha A. Epimenio Foot, MD, PhD, [place]    02/15/21   2:08 PM Certified in Neurology, Clinical Neurophysiology, Sleep Medicine, Pain  Medicine and Neuroimaging Director, [facility] at [facility]  [facility] [address] [place], [place] [address] [phone]

## 2021-04-04 ENCOUNTER — Encounter: Payer: Medicare Other | Attending: Physician Assistant | Admitting: Registered"

## 2021-04-04 ENCOUNTER — Encounter: Payer: Self-pay | Admitting: Registered"

## 2021-04-04 ENCOUNTER — Other Ambulatory Visit: Payer: Self-pay

## 2021-04-04 DIAGNOSIS — E663 Overweight: Secondary | ICD-10-CM | POA: Diagnosis present

## 2021-04-04 DIAGNOSIS — E7800 Pure hypercholesterolemia, unspecified: Secondary | ICD-10-CM | POA: Diagnosis not present

## 2021-04-04 DIAGNOSIS — I10 Essential (primary) hypertension: Secondary | ICD-10-CM | POA: Insufficient documentation

## 2021-04-04 DIAGNOSIS — Z6829 Body mass index (BMI) 29.0-29.9, adult: Secondary | ICD-10-CM | POA: Insufficient documentation

## 2021-04-04 NOTE — Progress Notes (Signed)
Medical Nutrition Therapy  Appointment Start time:  [phone]  Appointment End time:  0905  Primary concerns today: Would like to lose 20 lbs by birthday 11/14/1965  Referral diagnosis: 66.9 (changed to 66.3 due to BMI 29.8 which is <30) Preferred learning style: no preference indicated Learning readiness: contemplating, ready, change in progress   NUTRITION ASSESSMENT   Anthropometrics  Wt Readings from Last 3 Encounters:  04/04/21 207 lb 11.2 oz (94.2 kg)  02/15/21 206 lb (93.4 kg)  07/21/20 213 lb (96.6 kg)      Clinical Medical Hx: reviewed Medications: amlodipine, aubagio for MS Labs: LDL 179, HDL 51, TG 137 Notable Signs/Symptoms: fatigue  Lifestyle & Dietary Hx Pt states 20 years ago she weighed about 300 lbs and after a fall with bruising, decided to make changes including daily walking, gave up chocolate and soda. Pt states she likes to walk outside, has [employer] membership but finds it boring in the gym. Pt states she was doing Zumba at church, but they stopped offering it. Pt states she does some youtube exercise videos.  Pt states she is concerned that she is not getting proper nutrition because she is a picky eater. Doesn't like oatmeal or yogurt due to texture. Pt states she often skips breakfast. If she eats breakfast doesn't eat lunch. Pt reports she eats a light for dinner and not too close to bedtime so it doesn't affect her sleep.   Pt states she doesn't think she gets enough protein in her diet, doesn't like a lot of meat, mostly just chicken. Pt reports she likes pinto beans and black eyed peas. Pt states she has not tried lentils. Pt reports variety of vegetable intake and eats salad often, uses fat free Ranch Dressing and balsamic vinaigrette.  Pt states she has a hard time drinking more than 20 oz per day. Pt states she sips on sweet tea during the day. Pt makes her own tea and does not make it very sweet.  Estimated daily fluid intake: 20 oz water, 20 oz sweet  tea Supplements: biotin, vitamin D2, iron Sleep: 6+ hr, usually restful Stress / self-care: 1/10 Current average weekly physical activity: daily, 45 min  24-Hr Dietary Recall First Meal: skip OR banana, kind bar OR biscuit OR cereal OR breakfast bar Snack: none Second Meal: none Snack: none Third Meal: (something quick) 2 hot dogs, bun, chili, slaw Snack: none Beverages: water, sweet tea, sprite zero 2x month, chocolate milk occasionally   NUTRITION DIAGNOSIS  NB-1.1 Food and nutrition-related knowledge deficit As related to plant sources of protein besides meat with additional benefit of fiber.  As evidenced by pt states gain of new knowledge.   NUTRITION INTERVENTION  Nutrition education (E-1) on the following topics:  MyPlate guide for balanced eating Nutrition Facts Label Simple vs Complex carbohydrates  Handouts Provided Include  Protein Nutrition Facts label MyPlate Balanced eating Grocery shopping list template  Menu Planning with Complex Carb, Protein, non-starchy vegetables idea  Learning Style & Readiness for Change Teaching method utilized: Visual & Auditory  Demonstrated degree of understanding via: Teach Back  Barriers to learning/adherence to lifestyle change: none  Goals Established by Pt Aim to eat 3 balanced meals per day, including a lean protein  Consider including beans  3x/week Guidelines for healthy eating include eating 3-5 servings of vegetables, 1-2 servings of fruit per day Eating foods in more whole food form to get fiber Consider adding ground flaxseed in your smoothies, start with a teaspoon and gradually  increase to avoid GI upset. Continue reading nutrition facts food labels and limit saturated fats and added sugar.   MONITORING & EVALUATION Dietary intake, weekly physical activity, and weight in 6 weeks.  Next Steps  Patient is to return for continued evaluation of diet and lifestyle.

## 2021-04-04 NOTE — Patient Instructions (Addendum)
Aim to eat 3 balanced meals per day, including a lean protein  Consider including beans  3x/week Guidelines for healthy eating include eating 3-5 servings of vegetables, 1-2 servings of fruit per day Eating foods in more whole food form to get fiber Consider adding ground flaxseed in your smoothies, start with a teaspoon and gradually increase to avoid GI upset. Continue reading nutrition facts food labels and limit saturated fats and added sugar.

## 2021-05-06 ENCOUNTER — Ambulatory Visit: Payer: Medicare Other | Admitting: Registered"

## 2021-05-20 ENCOUNTER — Other Ambulatory Visit: Payer: Self-pay

## 2021-05-20 ENCOUNTER — Encounter: Payer: Medicare Other | Attending: Physician Assistant | Admitting: Registered"

## 2021-05-20 ENCOUNTER — Encounter: Payer: Self-pay | Admitting: Registered"

## 2021-05-20 VITALS — Wt 212.9 lb

## 2021-05-20 DIAGNOSIS — E663 Overweight: Secondary | ICD-10-CM | POA: Insufficient documentation

## 2021-05-20 DIAGNOSIS — I10 Essential (primary) hypertension: Secondary | ICD-10-CM | POA: Insufficient documentation

## 2021-05-20 DIAGNOSIS — Z6829 Body mass index (BMI) 29.0-29.9, adult: Secondary | ICD-10-CM | POA: Diagnosis not present

## 2021-05-20 DIAGNOSIS — Z713 Dietary counseling and surveillance: Secondary | ICD-10-CM | POA: Diagnosis not present

## 2021-05-20 DIAGNOSIS — E7800 Pure hypercholesterolemia, unspecified: Secondary | ICD-10-CM | POA: Insufficient documentation

## 2021-05-20 NOTE — Progress Notes (Signed)
Medical Nutrition Therapy  ?Appointment Start time:  [phone] Appointment End time:  0945 ? ?Primary concerns today: Pt states she is not focused weight loss now, but wants to keep up with daily exercise.  ?Referral diagnosis: 66.9 (changed to 66.3 due to BMI 29.8 which is <30) ?Preferred learning style: no preference indicated ?Learning readiness: contemplating, ready, change in progress ? ? ?NUTRITION ASSESSMENT  ? ?Anthropometrics  ?Wt Readings from Last 3 Encounters:  ?05/20/21 212 lb 14.4 oz (96.6 kg)  ?04/04/21 207 lb 11.2 oz (94.2 kg)  ?02/15/21 206 lb (93.4 kg)  ?   ? ?Clinical ?Medical Hx: reviewed ?Medications: amlodipine, aubagio for MS ?Labs: LDL 179, HDL 51, TG 137 ?Notable Signs/Symptoms: fatigue ? ?Lifestyle & Dietary Hx ?Pt reports since last visit she had COVID and has lingering fatigue and although COVID didn't initially change her taste, is experiencing some taste changes now. Pt also reports a lingering cough and medication that makes her even more sleepy. ? ?Pt states she has started exercising 30-45 min everyday and has an app to keep variety so she doesn't get bored. ? ?Pt states she feels she has made progress toward more fruits and vegetables.  ? ?Pt states now she wants to find alternative protein in her diet, doesn't like a lot of meat, mostly just chicken, hates fish. Patient has added some beans to diet but still not wanting to include reguarly ? ?Estimated daily fluid intake: not assessed this visit ?Supplements: biotin, vitamin D2, iron ?Sleep: 6+ hr, usually restful ?Stress / self-care: 1/10 ?Current average weekly physical activity: daily, 45 min ? ?24-Hr Dietary Recall ?First Meal: salad (green pepper, lettuce, tomato cucumbers) grilled chicken (yesterday: scrambled eggs, [place] sausage cooked at home and biscuit only from [employer] ?Snack: fruit ?Second Meal: finished  ?Snack: none ?Third Meal: salad (from [employer]) no dressing ?Snack: none ?Beverages: water, green tea instead of  black tea ? ?NUTRITION DIAGNOSIS  ?NB-1.1 Food and nutrition-related knowledge deficit As related to plant sources of protein besides meat with additional benefit of fiber.  As evidenced by pt states gain of new knowledge. ? ? ?NUTRITION INTERVENTION  ?Nutrition education (E-1) on the following topics:  ?Fiber ?Protein sources ? ?Handouts Provided Include  ?Types of Fiber ?Protein in Vegetarian and Vegan Diets ? ?Learning Style & Readiness for Change ?Teaching method utilized: Visual & Auditory  ?Demonstrated degree of understanding via: Teach Back  ?Barriers to learning/adherence to lifestyle change: none ? ?Goals Established by Pt ?New goals: ?Due to your goal of decreasing meat in diet: ?Continue including eggs, nuts and seeds in diet ?Consider making smoothies with [place] yogurt ?Review handout for ideas to add other plant based proteins ? ?Try adding more variety in your diet ?Grains: oats (try in smoothies) has a little protein too. ?More fiber in general, use handout for types of fiber and foods sources ?Pysillium husk is a supplement that is also help to keep a person regular ? ?Progress from first visit: ?Aim to eat 3 balanced meals per day, including a lean protein ? Pt states she isncluding more boiled with breakfast ?Consider including beans  3x/week ?Added some in chili but not adding on a regular basis ?Guidelines for healthy eating include eating 3-5 servings of vegetables, 1-2 servings of fruit per day ?2x week bowl of fruits with breakfast ?2-3x/week vegetables with dinner (each time when cooking at home) ?Gets vegetables instead of fries when eating out ?Eating foods in more whole food form to get fiber ?Consider adding  ground flaxseed in your smoothies, start with a teaspoon and gradually increase to avoid GI upset. ?Has the stuff for smoothies but not having smoothies ?Continue reading nutrition facts food labels and limit saturated fats and added sugar. ? ? ?MONITORING & EVALUATION ?Dietary  intake, weekly physical activity, and weight prn ? ?Next Steps  ?Patient is to return for continued evaluation of diet and lifestyle.  ?

## 2021-05-20 NOTE — Patient Instructions (Addendum)
Due to your goal of decreasing meat in diet: ?Continue including eggs, nuts and seeds in diet ?Consider making smoothies with [place] yogurt ?Review handout for ideas to add other plant based proteins ? ?Try adding more variety in your diet ?Grains: oats (try in smoothies) has a little protein too. ? ?More fiber in general, use handout for types of fiber and foods sources ?Pysillium husk is a supplement that is also help to keep a person regular ?

## 2021-08-17 NOTE — Progress Notes (Unsigned)
No chief complaint on file.    HISTORY OF PRESENT ILLNESS:  08/17/21 ALL:  Margaret Henderson is a 56 y.o. female here today for follow up for RRMS. She continues Aubagio. Last MR brain 05/2017 was stable.   She continues to do well. She denies new or exacerbating symptoms. She continues to work. She is sleeping well. Mood is stable. No difficulty walking. She denies vision changes. She is due for eye exam.   PCP following BP. She reports it is usually well managed on amlodipine. She took meds just prior to visit. She continues vitamin D 2000iu daily.    HISTORY (copied from Dr Garth Bigness previous note)  Margaret Henderson is a 56 y.o. woman with RRMS.     She was diagnsed in 2002 with a spina cord syndrome.   Update 02/15/21 She feels her MS is stable.   She is on Aubagio and tolerates it well.   She has no exacerbation or new neurologic function.  Her last exacerbation was > 10-15 years ago   She did Solu-medrol 3 times many years ago.      Lymphocytes have been low. (they were lower on Tecfidera).  Last CBC/D was 01/15/2020 - lymphocytes low normal at 0.7.  For a while, I had her change the Aubagio to every other day and we increase it back to daily when the lymphocytes returned to normal.  She has thalassemia and takes iron.   She has reduced MCV but good Hgb.  Her mother has anemia too.     She is walking well and balance is good.  No falls.  She works out and could easily walk a few miles.   She goes downstairs without using the bannister.    She denies numbness or tingling (though arms and legs go asleep easily but movements help after a couple minutes).    Vision is fine.   Bladder function is doing well.   She has mild fatigue but notes having to do more and being very busy.   She sleeps well.   Mood and cognition are doing well.   She notes some stress with her mom needing to go to a nursing home.   She is the only living child.  She sees her most days..      MS history:     She was diagnosed  with multiple sclerosis in 2002 after presenting with numbness below her waist and clumsiness in the arms and legs. At that time, she was found to have an MRI consistent with multiple sclerosis. She also underwent a lumbar puncture and CSF was  consistent with multiple sclerosis. She was started on Betaseron. Last year, an MRI of the brain showed 2 new foci not present in her earlier MRI. She had MRI's mid 2016 at [facility]. . The brain and cervical spine were unchanged but her thoracic spine showed a new focus compared to 2015.  She switched to Tecfidera in 2016 to change to an oral agent but lymphocytes were low.  She changed to Aubagio and for a while was on every other day due to low lymphocytes.   Other:   She has thalassemia and needs to take iron.    Last HgB was 11.8.      Imaging: MRI brain 01/22/2020 showed T2/flair hyperintense foci predominantly in the periventricular white matter consistent with chronic demyelinating plaque associated with multiple sclerosis.  None of the foci appear to be acute.  They do not enhance.  Compared to the MRI dated 05/18/2017, there are no new lesions..   There is a normal enhancement pattern and no acute findings.   REVIEW OF SYSTEMS: Out of a complete 14 system review of symptoms, the patient complains only of the following symptoms, none and all other reviewed systems are negative.   ALLERGIES: No Known Allergies   HOME MEDICATIONS: Outpatient Medications Prior to Visit  Medication Sig Dispense Refill   amLODipine (NORVASC) 5 MG tablet Take 5 mg by mouth daily.  4   Biotin 1000 MCG tablet Take 1,000 mcg by mouth 3 (three) times daily.     cholecalciferol (VITAMIN D) 1000 UNITS tablet Take 2,000 Units by mouth 2 (two) times daily.     Cyanocobalamin (B-12 PO) Take 500 mcg by mouth daily.     ferrous sulfate 325 (65 FE) MG tablet Take 325 mg by mouth daily with breakfast.     Teriflunomide (AUBAGIO) 14 MG TABS Take 1 tablet by mouth daily. 90 tablet 3    No facility-administered medications prior to visit.     PAST MEDICAL HISTORY: Past Medical History:  Diagnosis Date   Hypertension    Multiple sclerosis ([place])    Vision abnormalities      PAST SURGICAL HISTORY: Past Surgical History:  Procedure Laterality Date   TUBAL LIGATION  1994     FAMILY HISTORY: Family History  Problem Relation Age of Onset   Anemia Mother    Diabetes Mother    Hypertension Mother      SOCIAL HISTORY: Social History   Socioeconomic History   Marital status: Single    Spouse name: Not on file   Number of children: 2   Years of education: Not on file   Highest education level: Not on file  Occupational History   Occupation: Medical records  Tobacco Use   Smoking status: Never   Smokeless tobacco: Never  Substance and Sexual Activity   Alcohol use: No    Alcohol/week: 0.0 standard drinks   Drug use: No   Sexual activity: Not on file  Other Topics Concern   Not on file  Social History Narrative   Right handed    Caffeine use: Tea sometimes.   Social Determinants of Radio broadcast assistant Strain: Not on file  Food Insecurity: No Food Insecurity   Worried About Charity fundraiser in the Last Year: Never true   Ran Out of Food in the Last Year: Never true  Transportation Needs: Not on file  Physical Activity: Not on file  Stress: Not on file  Social Connections: Not on file  Intimate Partner Violence: Not on file      PHYSICAL EXAM  There were no vitals filed for this visit.  There is no height or weight on file to calculate BMI.   Generalized: Well developed, in no acute distress   Neurological examination  Mentation: Alert oriented to time, place, history taking. Follows all commands speech and language fluent Cranial nerve II-XII: Pupils were equal round reactive to light. Extraocular movements were full, visual field were full on confrontational test. Facial sensation and strength were normal. Uvula tongue  midline. Head turning and shoulder shrug  were normal and symmetric. Motor: The motor testing reveals 5 over 5 strength of all 4 extremities. Good symmetric motor tone is noted throughout.  Sensory: Sensory testing is intact to soft touch on all 4 extremities. No evidence of extinction is noted.  Coordination: Cerebellar testing reveals good finger-nose-finger and  heel-to-shin bilaterally.  Gait and station: Gait is normal.  Reflexes: Deep tendon reflexes are symmetric and normal bilaterally.     DIAGNOSTIC DATA (LABS, IMAGING, TESTING) - I reviewed patient records, labs, notes, testing and imaging myself where available.  Lab Results  Component Value Date   WBC 3.8 07/21/2020   HGB 12.1 07/21/2020   HCT 39.2 07/21/2020   MCV 76 (L) 07/21/2020   PLT 298 07/21/2020      Component Value Date/Time   NA 139 01/15/2020 0827   K 4.4 01/15/2020 0827   CL 103 01/15/2020 0827   CO2 26 01/15/2020 0827   GLUCOSE 83 01/15/2020 0827   BUN 7 01/15/2020 0827   CREATININE 0.69 01/15/2020 0827   CALCIUM 10.0 01/15/2020 0827   PROT 7.0 07/21/2020 0903   ALBUMIN 4.2 07/21/2020 0903   AST 20 07/21/2020 0903   ALT 19 07/21/2020 0903   ALKPHOS 96 07/21/2020 0903   BILITOT 0.3 07/21/2020 0903   GFRNONAA 100 01/15/2020 0827   GFRAA 116 01/15/2020 0827   No results found for: CHOL, HDL, LDLCALC, LDLDIRECT, TRIG, CHOLHDL No results found for: HGBA1C No results found for: VITAMINB12 No results found for: TSH    ASSESSMENT AND PLAN  56 y.o. year old female  has a past medical history of Hypertension, Multiple sclerosis ([place]), and Vision abnormalities. here with   No diagnosis found.  We will continue Aubagio as prescribed. We will update labs and MRI. She was encouraged to continue healthy lifestyle habits. She will continue vitamin D OTC. Regular follow up with PCP for HTN management. She will return to see Dr Felecia Shelling in 6 months.    Debbora Presto, MSN, FNP-C 08/17/2021, 1:33 PM  [facility] [address], [place], [place] [address] [phone]

## 2021-08-18 ENCOUNTER — Encounter: Payer: Self-pay | Admitting: Family Medicine

## 2021-08-18 ENCOUNTER — Ambulatory Visit (INDEPENDENT_AMBULATORY_CARE_PROVIDER_SITE_OTHER): Payer: Medicare Other | Admitting: Family Medicine

## 2021-08-18 VITALS — BP 174/81 | HR 71 | Ht 70.0 in | Wt 216.0 lb

## 2021-08-18 DIAGNOSIS — Z79899 Other long term (current) drug therapy: Secondary | ICD-10-CM

## 2021-08-18 DIAGNOSIS — G35 Multiple sclerosis: Secondary | ICD-10-CM

## 2021-08-18 DIAGNOSIS — E559 Vitamin D deficiency, unspecified: Secondary | ICD-10-CM | POA: Diagnosis not present

## 2021-08-18 NOTE — Patient Instructions (Signed)
Below is our plan: ? ?We will continue current treatment plan  ? ?Please make sure you are staying well hydrated. I recommend 50-60 ounces daily. Well balanced diet and regular exercise encouraged. Consistent sleep schedule with 6-8 hours recommended.  ? ?Please continue follow up with care team as directed.  ? ?Follow up with me in 6 months ? ?You may receive a survey regarding today's visit. I encourage you to leave honest feed back as I do use this information to improve patient care. Thank you for seeing me today!  ? ? ?

## 2021-08-19 LAB — CBC WITH DIFFERENTIAL/PLATELET
Basophils Absolute: 0 10*3/uL (ref 0.0–0.2)
Basos: 0 %
EOS (ABSOLUTE): 0 10*3/uL (ref 0.0–0.4)
Eos: 0 %
Hematocrit: 43.1 % (ref 34.0–46.6)
Hemoglobin: 14 g/dL (ref 11.1–15.9)
Immature Grans (Abs): 0 10*3/uL (ref 0.0–0.1)
Immature Granulocytes: 0 %
Lymphocytes Absolute: 1.1 10*3/uL (ref 0.7–3.1)
Lymphs: 15 %
MCH: 24.1 pg — ABNORMAL LOW (ref 26.6–33.0)
MCHC: 32.5 g/dL (ref 31.5–35.7)
MCV: 74 fL — ABNORMAL LOW (ref 79–97)
Monocytes Absolute: 0.5 10*3/uL (ref 0.1–0.9)
Monocytes: 7 %
Neutrophils Absolute: 5.7 10*3/uL (ref 1.4–7.0)
Neutrophils: 78 %
Platelets: 335 10*3/uL (ref 150–450)
RBC: 5.8 x10E6/uL — ABNORMAL HIGH (ref 3.77–5.28)
RDW: 16.4 % — ABNORMAL HIGH (ref 11.7–15.4)
WBC: 7.4 10*3/uL (ref 3.4–10.8)

## 2021-08-19 LAB — HEPATIC FUNCTION PANEL
ALT: 28 IU/L (ref 0–32)
AST: 20 IU/L (ref 0–40)
Albumin: 4.4 g/dL (ref 3.8–4.9)
Alkaline Phosphatase: 116 IU/L (ref 44–121)
Bilirubin Total: <0.2 mg/dL (ref 0.0–1.2)
Bilirubin, Direct: <0.1 mg/dL (ref 0.00–0.40)
Total Protein: 7.5 g/dL (ref 6.0–8.5)

## 2021-08-22 ENCOUNTER — Telehealth: Payer: Self-pay

## 2021-08-22 NOTE — Progress Notes (Signed)
See telephone note from 08/22/21.

## 2021-08-22 NOTE — Telephone Encounter (Signed)
Pt called and results relayed. Pt had no questions and was appreciative of call.

## 2021-08-22 NOTE — Telephone Encounter (Signed)
-----   Message from Shawnie Dapper, NP sent at 08/22/2021 11:54 AM EDT ----- Labs are stable.

## 2021-11-15 ENCOUNTER — Other Ambulatory Visit: Payer: Self-pay | Admitting: Neurology

## 2021-11-15 DIAGNOSIS — G35 Multiple sclerosis: Secondary | ICD-10-CM

## 2022-02-22 NOTE — Progress Notes (Signed)
Chief Complaint  Patient presents with   Follow-up    Pt in room #1 and alone. Pt here today for f/u on Aubagio for her MS.    HISTORY OF PRESENT ILLNESS:  02/27/22 ALL:  Margaret Henderson is a 56 y.o. female here today for follow up for RRMS. She continues Aubagio. Last MR brain 01/2020 was stable.   She continues to do well. She denies any significant changes. She did have an episode a couple of weeks ago where she felt that her right arm was weaker than left. She was able to get ready for work and when she got to work she felt that her gait was "off". She felt a little off balance. Symptoms lasted for about 4 hours then resolved spontaneously. No sensory changes. No vision, bowel or bladder changes. She reports making dietary changes a few months ago. She is trying to eat healthier and no longer eating red meat.   She continues to work. She is sleeping well. Mood is stable. No difficulty walking. She walks about 2 miles every day. She does cardio, yoga, HIT. She denies vision changes. Last eye exam normal in 12/2020.  PCP following BP. She reports it is usually well managed on amlodipine and HCTZ. Normal readings are 125-135/80's. BP is elevated at doctor visits. She is asymptomatic. She continues vitamin D 2000iu daily.    HISTORY (copied from Dr Bonnita Hollow previous note)  Margaret Henderson is a 56 y.o. woman with RRMS.     She was diagnsed in 2002 with a spina cord syndrome.   Update 02/15/21 She feels her MS is stable.   She is on Aubagio and tolerates it well.   She has no exacerbation or new neurologic function.  Her last exacerbation was > 10-15 years ago   She did Solu-medrol 3 times many years ago.      Lymphocytes have been low. (they were lower on Tecfidera).  Last CBC/D was 01/15/2020 - lymphocytes low normal at 0.7.  For a while, I had her change the Aubagio to every other day and we increase it back to daily when the lymphocytes returned to normal.  She has thalassemia and takes iron.    She has reduced MCV but good Hgb.  Her mother has anemia too.     She is walking well and balance is good.  No falls.  She works out and could easily walk a few miles.   She goes downstairs without using the bannister.    She denies numbness or tingling (though arms and legs go asleep easily but movements help after a couple minutes).    Vision is fine.   Bladder function is doing well.   She has mild fatigue but notes having to do more and being very busy.   She sleeps well.   Mood and cognition are doing well.   She notes some stress with her mom needing to go to a nursing home.   She is the only living child.  She sees her most days..      MS history:     She was diagnosed with multiple sclerosis in 2002 after presenting with numbness below her waist and clumsiness in the arms and legs. At that time, she was found to have an MRI consistent with multiple sclerosis. She also underwent a lumbar puncture and CSF was  consistent with multiple sclerosis. She was started on Betaseron. Last year, an MRI of the brain showed 2 new foci  not present in her earlier MRI. She had MRI's mid 2016 at [facility]. . The brain and cervical spine were unchanged but her thoracic spine showed a new focus compared to 2015.  She switched to Tecfidera in 2016 to change to an oral agent but lymphocytes were low.  She changed to Aubagio and for a while was on every other day due to low lymphocytes.   Other:   She has thalassemia and needs to take iron.    Last HgB was 11.8.      Imaging: MRI brain 01/22/2020 showed T2/flair hyperintense foci predominantly in the periventricular white matter consistent with chronic demyelinating plaque associated with multiple sclerosis.  None of the foci appear to be acute.  They do not enhance.  Compared to the MRI dated 05/18/2017, there are no new lesions..   There is a normal enhancement pattern and no acute findings.   REVIEW OF SYSTEMS: Out of a complete 14 system review of symptoms, the  patient complains only of the following symptoms, none and all other reviewed systems are negative.   ALLERGIES: No Known Allergies   HOME MEDICATIONS: Outpatient Medications Prior to Visit  Medication Sig Dispense Refill   amLODipine (NORVASC) 5 MG tablet Take 5 mg by mouth daily.  4   Biotin 1000 MCG tablet Take 1,000 mcg by mouth 3 (three) times daily.     cholecalciferol (VITAMIN D) 1000 UNITS tablet Take 2,000 Units by mouth 2 (two) times daily.     ferrous sulfate 325 (65 FE) MG tablet Take 325 mg by mouth daily with breakfast.     hydrochlorothiazide (HYDRODIURIL) 25 MG tablet Take 25 mg by mouth daily.     Teriflunomide 14 MG TABS TAKE 1 TABLET BY MOUTH  DAILY 90 tablet 2   predniSONE (DELTASONE) 20 MG tablet Take by mouth. (Patient not taking: Reported on 02/27/2022)     No facility-administered medications prior to visit.     PAST MEDICAL HISTORY: Past Medical History:  Diagnosis Date   Hypertension    Multiple sclerosis ([facility])    Vision abnormalities      PAST SURGICAL HISTORY: Past Surgical History:  Procedure Laterality Date   TUBAL LIGATION  1994     FAMILY HISTORY: Family History  Problem Relation Age of Onset   Anemia Mother    Diabetes Mother    Hypertension Mother      SOCIAL HISTORY: Social History   Socioeconomic History   Marital status: Single    Spouse name: Not on file   Number of children: 2   Years of education: Not on file   Highest education level: Not on file  Occupational History   Occupation: Medical records  Tobacco Use   Smoking status: Never   Smokeless tobacco: Never  Substance and Sexual Activity   Alcohol use: No    Alcohol/week: 0.0 standard drinks of alcohol   Drug use: No   Sexual activity: Not on file  Other Topics Concern   Not on file  Social History Narrative   Right handed    Caffeine use: Tea sometimes.   Social Determinants of Health   Financial Resource Strain: Not on file  Food Insecurity: No  Food Insecurity (04/04/2021)   Hunger Vital Sign    Worried About Running Out of Food in the Last Year: Never true    Ran Out of Food in the Last Year: Never true  Transportation Needs: Not on file  Physical Activity: Not on file  Stress:  Not on file  Social Connections: Not on file  Intimate Partner Violence: Not on file      PHYSICAL EXAM  Vitals:   02/27/22 0741  BP: (!) 164/82  Pulse: 79  Weight: 213 lb 8 oz (96.8 kg)  Height: 5\' 9"  (1.753 m)     Body mass index is 31.53 kg/m.   Generalized: Well developed, in no acute distress   Neurological examination  Mentation: Alert oriented to time, place, history taking. Follows all commands speech and language fluent Cranial nerve II-XII: Pupils were equal round reactive to light. Extraocular movements were full, visual field were full on confrontational test. Facial sensation and strength were normal. Uvula tongue midline. Head turning and shoulder shrug  were normal and symmetric. Motor: The motor testing reveals 5 over 5 strength of all 4 extremities. Good symmetric motor tone is noted throughout.  Sensory: Sensory testing is intact to soft touch on all 4 extremities. No evidence of extinction is noted.  Coordination: Cerebellar testing reveals good finger-nose-finger and heel-to-shin bilaterally.  Gait and station: Gait is normal.  Reflexes: Deep tendon reflexes are symmetric and normal bilaterally.     DIAGNOSTIC DATA (LABS, IMAGING, TESTING) - I reviewed patient records, labs, notes, testing and imaging myself where available.  Lab Results  Component Value Date   WBC 7.4 08/18/2021   HGB 14.0 08/18/2021   HCT 43.1 08/18/2021   MCV 74 (L) 08/18/2021   PLT 335 08/18/2021      Component Value Date/Time   NA 139 01/15/2020 0827   K 4.4 01/15/2020 0827   CL 103 01/15/2020 0827   CO2 26 01/15/2020 0827   GLUCOSE 83 01/15/2020 0827   BUN 7 01/15/2020 0827   CREATININE 0.69 01/15/2020 0827   CALCIUM 10.0  01/15/2020 0827   PROT 7.5 08/18/2021 0804   ALBUMIN 4.4 08/18/2021 0804   AST 20 08/18/2021 0804   ALT 28 08/18/2021 0804   ALKPHOS 116 08/18/2021 0804   BILITOT <0.2 08/18/2021 0804   GFRNONAA 100 01/15/2020 0827   GFRAA 116 01/15/2020 0827   No results found for: "CHOL", "HDL", "LDLCALC", "LDLDIRECT", "TRIG", "CHOLHDL" No results found for: "HGBA1C" No results found for: "VITAMINB12" No results found for: "TSH"    ASSESSMENT AND PLAN  56 y.o. year old female  has a past medical history of Hypertension, Multiple sclerosis ([facility]), and Vision abnormalities. here with   Multiple sclerosis ([facility]) - Plan: CBC with Differential/Platelets, Hepatic Function Panel, MR BRAIN W WO CONTRAST  High risk medication use  Vitamin D deficiency - Plan: Vitamin D, 25-hydroxy  We will continue Aubagio as prescribed. We will update labs. MRI stable 01/2020. I will repeat for surveillance. She was encouraged to continue healthy lifestyle habits. She will continue vitamin D OTC. Regular follow up with PCP for HTN management. Continue to monitor closely at home. She will return to see Dr 02/2020 in 6 months.    Epimenio Foot, MSN, FNP-C 02/27/2022, 8:15 AM  [facility] [address], [address] [place], [place] [phone]

## 2022-02-22 NOTE — Patient Instructions (Signed)
Below is our plan:  We will continue current treatment plan. I will update labs, today. I will order an MRI for monitoring. Please keep an eye on your blood pressure at home.   Please make sure you are staying well hydrated. I recommend 50-60 ounces daily. Well balanced diet and regular exercise encouraged. Consistent sleep schedule with 6-8 hours recommended.   Please continue follow up with care team as directed.   Follow up with Dr Epimenio Foot in 3 months   You may receive a survey regarding today's visit. I encourage you to leave honest feed back as I do use this information to improve patient care. Thank you for seeing me today!

## 2022-02-27 ENCOUNTER — Ambulatory Visit (INDEPENDENT_AMBULATORY_CARE_PROVIDER_SITE_OTHER): Payer: Medicare Other | Admitting: Family Medicine

## 2022-02-27 ENCOUNTER — Telehealth: Payer: Self-pay | Admitting: Family Medicine

## 2022-02-27 ENCOUNTER — Ambulatory Visit: Payer: Medicare Other | Admitting: Family Medicine

## 2022-02-27 ENCOUNTER — Encounter: Payer: Self-pay | Admitting: Family Medicine

## 2022-02-27 VITALS — BP 164/82 | HR 79 | Ht 69.0 in | Wt 213.5 lb

## 2022-02-27 DIAGNOSIS — E559 Vitamin D deficiency, unspecified: Secondary | ICD-10-CM | POA: Diagnosis not present

## 2022-02-27 DIAGNOSIS — Z79899 Other long term (current) drug therapy: Secondary | ICD-10-CM

## 2022-02-27 DIAGNOSIS — G35 Multiple sclerosis: Secondary | ICD-10-CM

## 2022-02-27 NOTE — Telephone Encounter (Signed)
Sent to [employer] for [employer] and [place] [employer].

## 2022-02-28 LAB — CBC WITH DIFFERENTIAL/PLATELET
Basophils Absolute: 0 10*3/uL (ref 0.0–0.2)
Basos: 0 %
EOS (ABSOLUTE): 0.1 10*3/uL (ref 0.0–0.4)
Eos: 3 %
Hematocrit: 40.3 % (ref 34.0–46.6)
Hemoglobin: 13.4 g/dL (ref 11.1–15.9)
Immature Grans (Abs): 0 10*3/uL (ref 0.0–0.1)
Immature Granulocytes: 0 %
Lymphocytes Absolute: 1.1 10*3/uL (ref 0.7–3.1)
Lymphs: 23 %
MCH: 24.3 pg — ABNORMAL LOW (ref 26.6–33.0)
MCHC: 33.3 g/dL (ref 31.5–35.7)
MCV: 73 fL — ABNORMAL LOW (ref 79–97)
Monocytes Absolute: 0.6 10*3/uL (ref 0.1–0.9)
Monocytes: 12 %
Neutrophils Absolute: 3 10*3/uL (ref 1.4–7.0)
Neutrophils: 62 %
Platelets: 307 10*3/uL (ref 150–450)
RBC: 5.52 x10E6/uL — ABNORMAL HIGH (ref 3.77–5.28)
RDW: 15.1 % (ref 11.7–15.4)
WBC: 4.7 10*3/uL (ref 3.4–10.8)

## 2022-02-28 LAB — HEPATIC FUNCTION PANEL
ALT: 15 IU/L (ref 0–32)
AST: 15 IU/L (ref 0–40)
Albumin: 4.3 g/dL (ref 3.8–4.9)
Alkaline Phosphatase: 100 IU/L (ref 44–121)
Bilirubin Total: 0.3 mg/dL (ref 0.0–1.2)
Bilirubin, Direct: 0.1 mg/dL (ref 0.00–0.40)
Total Protein: 7.2 g/dL (ref 6.0–8.5)

## 2022-02-28 LAB — VITAMIN D 25 HYDROXY (VIT D DEFICIENCY, FRACTURES): Vit D, 25-Hydroxy: 33.2 ng/mL (ref 30.0–100.0)

## 2022-03-23 DIAGNOSIS — M25531 Pain in right wrist: Secondary | ICD-10-CM | POA: Diagnosis not present

## 2022-03-25 ENCOUNTER — Ambulatory Visit
Admission: RE | Admit: 2022-03-25 | Discharge: 2022-03-25 | Disposition: A | Discharge status: Home / Self Care | Payer: Medicare Other | Source: Ambulatory Visit | Attending: Family Medicine | Admitting: Family Medicine

## 2022-03-25 DIAGNOSIS — R413 Other amnesia: Secondary | ICD-10-CM | POA: Diagnosis not present

## 2022-03-25 DIAGNOSIS — G35 Multiple sclerosis: Secondary | ICD-10-CM

## 2022-03-25 MED ORDER — GADOPICLENOL 0.5 MMOL/ML IV SOLN
10.0000 mL | Freq: Once | INTRAVENOUS | Status: AC | PRN
  Administered 2022-03-25: 10 mL via INTRAVENOUS

## 2022-04-07 DIAGNOSIS — G35 Multiple sclerosis: Secondary | ICD-10-CM | POA: Diagnosis not present

## 2022-04-07 DIAGNOSIS — I10 Essential (primary) hypertension: Secondary | ICD-10-CM | POA: Diagnosis not present

## 2022-04-07 DIAGNOSIS — E7800 Pure hypercholesterolemia, unspecified: Secondary | ICD-10-CM | POA: Diagnosis not present

## 2022-04-25 DIAGNOSIS — E559 Vitamin D deficiency, unspecified: Secondary | ICD-10-CM | POA: Diagnosis not present

## 2022-05-23 DIAGNOSIS — I10 Essential (primary) hypertension: Secondary | ICD-10-CM | POA: Diagnosis not present

## 2022-05-23 DIAGNOSIS — E876 Hypokalemia: Secondary | ICD-10-CM | POA: Diagnosis not present

## 2022-05-24 DIAGNOSIS — I10 Essential (primary) hypertension: Secondary | ICD-10-CM | POA: Diagnosis not present

## 2022-07-22 DIAGNOSIS — T148XXA Other injury of unspecified body region, initial encounter: Secondary | ICD-10-CM | POA: Diagnosis not present

## 2022-07-22 DIAGNOSIS — M25511 Pain in right shoulder: Secondary | ICD-10-CM | POA: Diagnosis not present

## 2022-07-22 DIAGNOSIS — M19011 Primary osteoarthritis, right shoulder: Secondary | ICD-10-CM | POA: Diagnosis not present

## 2022-08-01 DIAGNOSIS — M7581 Other shoulder lesions, right shoulder: Secondary | ICD-10-CM | POA: Diagnosis not present

## 2022-08-03 ENCOUNTER — Other Ambulatory Visit: Payer: Self-pay | Admitting: *Deleted

## 2022-08-03 DIAGNOSIS — G35 Multiple sclerosis: Secondary | ICD-10-CM

## 2022-08-03 MED ORDER — TERIFLUNOMIDE 14 MG PO TABS: 1.0000 | ORAL_TABLET | Freq: Every day | ORAL | 0 refills | Status: DC

## 2022-08-25 DIAGNOSIS — M7581 Other shoulder lesions, right shoulder: Secondary | ICD-10-CM | POA: Diagnosis not present

## 2022-09-07 ENCOUNTER — Ambulatory Visit (INDEPENDENT_AMBULATORY_CARE_PROVIDER_SITE_OTHER): Payer: 59 | Admitting: Neurology

## 2022-09-07 ENCOUNTER — Encounter: Payer: Self-pay | Admitting: Neurology

## 2022-09-07 VITALS — BP 146/83 | HR 68 | Ht 69.5 in | Wt 207.5 lb

## 2022-09-07 DIAGNOSIS — R269 Unspecified abnormalities of gait and mobility: Secondary | ICD-10-CM

## 2022-09-07 DIAGNOSIS — G35 Multiple sclerosis: Secondary | ICD-10-CM | POA: Diagnosis not present

## 2022-09-07 DIAGNOSIS — Z79899 Other long term (current) drug therapy: Secondary | ICD-10-CM | POA: Diagnosis not present

## 2022-09-07 DIAGNOSIS — E559 Vitamin D deficiency, unspecified: Secondary | ICD-10-CM

## 2022-09-07 NOTE — Progress Notes (Signed)
[facility]  PATIENT: Margaret Henderson DOB: 09-29-1965  REFERRING DOCTOR OR PCP:  Dr. Lonie Peak [facility]), Neuro:  Lincoln Brigham SOURCE: paitent and [facility] form [facility]  _________________________________   HISTORICAL  CHIEF COMPLAINT:  Chief Complaint  Patient presents with   Follow-up    Pt in room 10. Here for MS follow up. Pt said she is doing awesome no problems or concerns.     HISTORY OF PRESENT ILLNESS:  Margaret Henderson is a 57 y.o. woman with RRMS.     She was diagnsed in 2002 with a spina cord syndrome.  Update 09/07/22 She feels her MS is stable.   She is on Aubagio and tolerates it well.   She has no exacerbation or new neurologic function.  Her last exacerbation was > 15 years ago   She did Solu-medrol 3 times many years ago.      Lymphocytes have been low. (they were lower on Tecfidera).  Last CBC/D was 01/15/2020 - lymphocytes low normal at 0.7.  For a while, I had her change the Aubagio to every other day and we increase it back to daily when the lymphocytes returned to normal.  She has thalassemia and takes iron.   She has reduced MCV but good Hgb.  Her mother has anemia too.    Her gait is doing well.  Balance is good.  No falls.  She works out and could easily walk a few miles keeping up with everyone.   She goes downstairs without using the bannister.    She denies numbness or tingling (though arms and legs go asleep easily but movements help after a couple minutes).    Vision is fine.   Bladder function is doing well.   Often has  nocturia.  She has mild fatigue but notes having to do more and being very busy.   She sleeps well.   Mood and cognition are doing well.   Her mom passed 08/05/2022.    She is the only living child.  She sees her most days..     MS history:     She was diagnosed with multiple sclerosis in 2002 after presenting with numbness below her waist and clumsiness in the arms and legs. At that time, she was found to have an  MRI consistent with multiple sclerosis. She also underwent a lumbar puncture and CSF was  consistent with multiple sclerosis. She was started on Betaseron. Last year, an MRI of the brain showed 2 new foci not present in her earlier MRI. She had MRI's mid 2016 at [facility]. . The brain and cervical spine were unchanged but her thoracic spine showed a new focus compared to 2015.  She switched to Tecfidera in 2016 to change to an oral agent but lymphocytes were low.  She changed to Aubagio and for a while was on every other day due to low lymphocytes.  Other:   She has thalassemia and needs to take iron.    Last HgB was 11.8.     Imaging: MRI brain 01/22/2020 showed T2/flair hyperintense foci predominantly in the periventricular white matter consistent with chronic demyelinating plaque associated with multiple sclerosis.  None of the foci appear to be acute.  They do not enhance.  Compared to the MRI dated 05/18/2017, there are no new lesions..   There is a normal enhancement pattern and no acute findings.  MRI brain 03/25/2022 showed no new lesions.     REVIEW OF SYSTEMS: Constitutional: No  fevers, chills, sweats, or change in appetite.   Rare fatigue Eyes: No visual changes, double vision, eye pain Ear, nose and throat: No hearing loss, ear pain, nasal congestion, sore throat Cardiovascular: No chest pain, palpitations Respiratory:  No shortness of breath at rest or with exertion.   No wheezes GastrointestinaI: No nausea, vomiting, diarrhea, abdominal pain, fecal incontinence Genitourinary:  No dysuria, urinary retention or frequency.  No nocturia. Musculoskeletal:  No neck pain, back pain Integumentary: No rash, pruritus, skin lesions Neurological: as above Psychiatric: No depression at this time.  No anxiety Endocrine: No palpitations, diaphoresis, change in appetite, change in weigh or increased thirst Hematologic/Lymphatic:  No anemia, purpura, petechiae. Allergic/Immunologic: No itchy/runny  eyes, nasal congestion, recent allergic reactions, rashes  ALLERGIES: Allergies  Allergen Reactions   Prednisone Other (See Comments)    Headache     HOME MEDICATIONS:  Current Outpatient Medications:    amLODipine (NORVASC) 5 MG tablet, Take 5 mg by mouth daily., Disp: , Rfl: 4   Biotin 1000 MCG tablet, Take 1,000 mcg by mouth 3 (three) times daily., Disp: , Rfl:    cholecalciferol (VITAMIN D) 1000 UNITS tablet, Take 2,000 Units by mouth 2 (two) times daily., Disp: , Rfl:    ferrous sulfate 325 (65 FE) MG tablet, Take 325 mg by mouth daily with breakfast., Disp: , Rfl:    losartan (COZAAR) 25 MG tablet, Take 25 mg by mouth daily., Disp: , Rfl:    Teriflunomide 14 MG TABS, Take 1 tablet (14 mg total) by mouth daily., Disp: 90 tablet, Rfl: 0   hydrochlorothiazide (HYDRODIURIL) 25 MG tablet, Take 25 mg by mouth daily. (Patient not taking: Reported on 09/07/2022), Disp: , Rfl:   PAST MEDICAL HISTORY: Past Medical History:  Diagnosis Date   Hypertension    Multiple sclerosis (HCC)    Vision abnormalities     PAST SURGICAL HISTORY: Past Surgical History:  Procedure Laterality Date   TUBAL LIGATION  1994    FAMILY HISTORY: Family History  Problem Relation Age of Onset   Anemia Mother    Diabetes Mother    Hypertension Mother     SOCIAL HISTORY:  Social History   Socioeconomic History   Marital status: Married    Spouse name: Not on file   Number of children: 2   Years of education: Not on file   Highest education level: Not on file  Occupational History   Occupation: Medical records  Tobacco Use   Smoking status: Never   Smokeless tobacco: Never  Substance and Sexual Activity   Alcohol use: No    Alcohol/week: 0.0 standard drinks of alcohol   Drug use: No   Sexual activity: Not on file  Other Topics Concern   Not on file  Social History Narrative   Right handed    Caffeine use: Tea sometimes.   Social Determinants of Health   Financial Resource Strain:  Not on file  Food Insecurity: No Food Insecurity (04/04/2021)   Hunger Vital Sign    Worried About Running Out of Food in the Last Year: Never true    Ran Out of Food in the Last Year: Never true  Transportation Needs: Not on file  Physical Activity: Not on file  Stress: Not on file  Social Connections: Not on file  Intimate Partner Violence: Not on file     PHYSICAL EXAM  Vitals:   09/07/22 0812 09/07/22 0816  BP: (!) 142/81 (!) 146/83  Pulse: 68 68  Weight: 207 lb  8 oz (94.1 kg)   Height: 5' 9.5" (1.765 m)     Body mass index is 30.2 kg/m.   General: The patient is well-developed and well-nourished and in no acute distress   Neurologic Exam  Mental status: The patient is alert and oriented x 3 at the time of the examination. The patient has apparent normal recent and remote memory, with an apparently normal attention span and concentration ability.   Speech is normal.  Cranial nerves: Extraocular movements are full.  Color vision was normal and symmetric.  Facial strength and sensation was normal.  Trapezius strength is strong.. No obvious hearing deficits are noted.  Motor:  Muscle bulk is normal.   Muscle tone is normal. Strength is 5/5.   Sensory: Sensory testing showed normal sensation to temperature and vibration sensation in the arms and legs  Coordination: Finger-nose-finger is normal in both arms.  Heel-to-shin normal in both arms.  Gait and station: Station is normal.  Gait was normal but tandem gait is minimally wide.  She has no Romberg sign  Reflexes: Deep tendon reflexes are symmetric and normal bilaterally.       ASSESSMENT AND PLAN  Multiple sclerosis (HCC)  High risk medication use  Gait disturbance  Vitamin D deficiency   1.   She will continue Aubagio 14 mg qd.  Lymphocytes anf LFT were fine when last checked.  She had recent labs with PCP and told all fine except potassium a little low and calcium high.   Hydrochlorothiazide was changed  to Losartan by PCP.   Will check LFT/CBC/D at next visit and MRI q 2-3 years.  We discussed if continues to do very well may be able to stop DMT around age 41 2.  Continue vitamin D supplements. 3.   Stay active and exercise.    She exercises daily. 4.   Return to clinic 6 months or sooner if there are new or worsening neurologic symptoms.   Maddyson Keil A. Epimenio Foot, MD, PhD, [place]    09/07/22   8:47 AM Certified in Neurology, Clinical Neurophysiology, Sleep Medicine, Pain Medicine and Neuroimaging Director, [facility]  [facility] [address] [place], [place] [address] [phone]

## 2022-09-20 DIAGNOSIS — M7501 Adhesive capsulitis of right shoulder: Secondary | ICD-10-CM | POA: Diagnosis not present

## 2022-10-09 DIAGNOSIS — I10 Essential (primary) hypertension: Secondary | ICD-10-CM | POA: Diagnosis not present

## 2022-10-09 DIAGNOSIS — G35 Multiple sclerosis: Secondary | ICD-10-CM | POA: Diagnosis not present

## 2022-10-09 DIAGNOSIS — R5383 Other fatigue: Secondary | ICD-10-CM | POA: Diagnosis not present

## 2022-10-09 DIAGNOSIS — E7800 Pure hypercholesterolemia, unspecified: Secondary | ICD-10-CM | POA: Diagnosis not present

## 2022-11-17 DIAGNOSIS — Z1231 Encounter for screening mammogram for malignant neoplasm of breast: Secondary | ICD-10-CM | POA: Diagnosis not present

## 2022-11-25 DIAGNOSIS — H5213 Myopia, bilateral: Secondary | ICD-10-CM | POA: Diagnosis not present

## 2022-11-25 DIAGNOSIS — H40003 Preglaucoma, unspecified, bilateral: Secondary | ICD-10-CM | POA: Diagnosis not present

## 2022-12-04 ENCOUNTER — Other Ambulatory Visit: Payer: Self-pay

## 2022-12-04 DIAGNOSIS — G35 Multiple sclerosis: Secondary | ICD-10-CM

## 2022-12-04 DIAGNOSIS — G35D Multiple sclerosis, unspecified: Secondary | ICD-10-CM

## 2022-12-04 MED ORDER — TERIFLUNOMIDE 14 MG PO TABS: 1.0000 | ORAL_TABLET | Freq: Every day | ORAL | 0 refills | Status: DC

## 2022-12-05 DIAGNOSIS — Z9181 History of falling: Secondary | ICD-10-CM | POA: Diagnosis not present

## 2022-12-05 DIAGNOSIS — Z139 Encounter for screening, unspecified: Secondary | ICD-10-CM | POA: Diagnosis not present

## 2022-12-05 DIAGNOSIS — Z0000 Encounter for general adult medical examination without abnormal findings: Secondary | ICD-10-CM | POA: Diagnosis not present

## 2022-12-15 DIAGNOSIS — Z23 Encounter for immunization: Secondary | ICD-10-CM | POA: Diagnosis not present

## 2022-12-15 DIAGNOSIS — G35 Multiple sclerosis: Secondary | ICD-10-CM | POA: Diagnosis not present

## 2022-12-15 DIAGNOSIS — I10 Essential (primary) hypertension: Secondary | ICD-10-CM | POA: Diagnosis not present

## 2023-01-15 DIAGNOSIS — M7501 Adhesive capsulitis of right shoulder: Secondary | ICD-10-CM | POA: Diagnosis not present

## 2023-01-29 ENCOUNTER — Other Ambulatory Visit: Payer: Self-pay

## 2023-01-29 DIAGNOSIS — G35 Multiple sclerosis: Secondary | ICD-10-CM

## 2023-01-29 MED ORDER — TERIFLUNOMIDE 14 MG PO TABS: 1.0000 | ORAL_TABLET | Freq: Every day | ORAL | 0 refills | Status: DC

## 2023-02-21 DIAGNOSIS — M7501 Adhesive capsulitis of right shoulder: Secondary | ICD-10-CM | POA: Diagnosis not present

## 2023-03-22 ENCOUNTER — Encounter: Payer: Self-pay | Admitting: Neurology

## 2023-03-22 ENCOUNTER — Ambulatory Visit (INDEPENDENT_AMBULATORY_CARE_PROVIDER_SITE_OTHER): Payer: 59 | Admitting: Neurology

## 2023-03-22 VITALS — BP 146/75 | HR 80 | Ht 69.0 in | Wt 216.5 lb

## 2023-03-22 DIAGNOSIS — G35 Multiple sclerosis: Secondary | ICD-10-CM | POA: Diagnosis not present

## 2023-03-22 DIAGNOSIS — E559 Vitamin D deficiency, unspecified: Secondary | ICD-10-CM | POA: Diagnosis not present

## 2023-03-22 DIAGNOSIS — Z79899 Other long term (current) drug therapy: Secondary | ICD-10-CM

## 2023-03-22 DIAGNOSIS — R269 Unspecified abnormalities of gait and mobility: Secondary | ICD-10-CM | POA: Diagnosis not present

## 2023-03-22 MED ORDER — TERIFLUNOMIDE 14 MG PO TABS: 1.0000 | ORAL_TABLET | Freq: Every day | ORAL | 4 refills | Status: DC

## 2023-03-22 NOTE — Progress Notes (Signed)
 [facility]  PATIENT: Margaret Henderson DOB: Dec 16, 1965  REFERRING DOCTOR OR PCP:  Dr. Rankin Dike [facility]), Neuro:  Rosella Mau SOURCE: paitent and records form [facility]  _________________________________   HISTORICAL  CHIEF COMPLAINT:  Chief Complaint  Patient presents with   Room 10    Pt is here Alone. Pt denies any new symptoms or complaints with her MS since her last appointment. Pt is doing well.     HISTORY OF PRESENT ILLNESS:  Margaret Henderson is a 58 y.o. woman with RRMS.     She was diagnsed in 2002 with a spinal cord syndrome.  Update 03/22/23 She feels her MS is stable.   She is on teriflunomide  and tolerates it well.   She has no exacerbation or new neurologic function.  Her last exacerbation was > 15 years ago   She did Solu-medrol  3 times many years ago.      Lymphocytes have been low. (they were lower on Tecfidera ).  They were 1.1 12/23.  She has thalassemia and takes iron.   She has reduced MCV but good Hgb.     MRI 03/25/2022 showed no new lesions  Her gait is doing well.  Balance is good.  No falls.  She works out [employer] every morning and some cardio and could easily walk a few miles keeping up with everyone.   She goes downstairs without using the bannister.    She denies numbness or tingling (though arms and legs go asleep easily but movements help after a couple minutes).    Vision is fine.   Bladder function is doing well.   She wakes up once at night to urinate most nights, more if she drinks more at night.    She has mild fatigue but notes having to do more and being very busy.   She sleeps well.   Cognition is doing well.   Her mom passed 08/05/2022.   She had been the only living child.  Mood is doing ok.      MS history:     She was diagnosed with multiple sclerosis in 2002 after presenting with numbness below her waist and clumsiness in the arms and legs. At that time, she was found to have an MRI consistent with multiple  sclerosis. She also underwent a lumbar puncture and CSF was  consistent with multiple sclerosis. She was started on Betaseron. Last year, an MRI of the brain showed 2 new foci not present in her earlier MRI. She had MRI's mid 2016 at [facility]. . The brain and cervical spine were unchanged but her thoracic spine showed a new focus compared to 2015.  She switched to Tecfidera  in 2016 to change to an oral agent but lymphocytes were low.  She changed to Aubagio  and for a while was on every other day due to low lymphocytes.  Other:   She has thalassemia and needs to take iron.     Imaging: MRI brain 01/22/2020 showed T2/flair hyperintense foci predominantly in the periventricular white matter consistent with chronic demyelinating plaque associated with multiple sclerosis.  None of the foci appear to be acute.  They do not enhance.  Compared to the MRI dated 05/18/2017, there are no new lesions..   There is a normal enhancement pattern and no acute findings.  MRI brain 03/25/2022 showed no new lesions.     REVIEW OF SYSTEMS: Constitutional: No fevers, chills, sweats, or change in appetite.   Rare fatigue Eyes: No visual  changes, double vision, eye pain Ear, nose and throat: No hearing loss, ear pain, nasal congestion, sore throat Cardiovascular: No chest pain, palpitations Respiratory:  No shortness of breath at rest or with exertion.   No wheezes GastrointestinaI: No nausea, vomiting, diarrhea, abdominal pain, fecal incontinence Genitourinary:  No dysuria, urinary retention or frequency.  No nocturia. Musculoskeletal:  No neck pain, back pain Integumentary: No rash, pruritus, skin lesions Neurological: as above Psychiatric: No depression at this time.  No anxiety Endocrine: No palpitations, diaphoresis, change in appetite, change in weigh or increased thirst Hematologic/Lymphatic:  No anemia, purpura, petechiae. Allergic/Immunologic: No itchy/runny eyes, nasal congestion, recent allergic reactions,  rashes  ALLERGIES: Allergies  Allergen Reactions   Prednisone Other (See Comments)    Headache     HOME MEDICATIONS:  Current Outpatient Medications:    amLODipine (NORVASC) 5 MG tablet, Take 10 mg by mouth daily. 10mg , Disp: , Rfl: 4   Biotin 1000 MCG tablet, Take 1,000 mcg by mouth 3 (three) times daily., Disp: , Rfl:    cholecalciferol (VITAMIN D ) 1000 UNITS tablet, Take 2,000 Units by mouth 2 (two) times daily., Disp: , Rfl:    ferrous sulfate 325 (65 FE) MG tablet, Take 325 mg by mouth daily with breakfast., Disp: , Rfl:    losartan (COZAAR) 25 MG tablet, Take 25 mg by mouth daily., Disp: , Rfl:    vitamin B-12 (CYANOCOBALAMIN) 500 MCG tablet, Take 500 mcg by mouth daily., Disp: , Rfl:    hydrochlorothiazide (HYDRODIURIL) 25 MG tablet, Take 25 mg by mouth daily. (Patient not taking: Reported on 03/22/2023), Disp: , Rfl:    Teriflunomide  14 MG TABS, Take 1 tablet (14 mg total) by mouth daily., Disp: 90 tablet, Rfl: 4  PAST MEDICAL HISTORY: Past Medical History:  Diagnosis Date   Hypertension    Multiple sclerosis (HCC)    Vision abnormalities     PAST SURGICAL HISTORY: Past Surgical History:  Procedure Laterality Date   TUBAL LIGATION  1994    FAMILY HISTORY: Family History  Problem Relation Age of Onset   Anemia Mother    Diabetes Mother    Hypertension Mother    Multiple sclerosis Neg Hx     SOCIAL HISTORY:  Social History   Socioeconomic History   Marital status: Married    Spouse name: Not on file   Number of children: 2   Years of education: Not on file   Highest education level: Not on file  Occupational History   Occupation: Medical records  Tobacco Use   Smoking status: Never   Smokeless tobacco: Never  Substance and Sexual Activity   Alcohol use: No    Alcohol/week: 0.0 standard drinks of alcohol   Drug use: No   Sexual activity: Not on file  Other Topics Concern   Not on file  Social History Narrative   Right handed    Caffeine use: Tea  sometimes.   Social Drivers of Corporate Investment Banker Strain: Not on file  Food Insecurity: No Food Insecurity (04/04/2021)   Hunger Vital Sign    Worried About Running Out of Food in the Last Year: Never true    Ran Out of Food in the Last Year: Never true  Transportation Needs: Not on file  Physical Activity: Not on file  Stress: Not on file  Social Connections: Not on file  Intimate Partner Violence: Not on file     PHYSICAL EXAM  Vitals:   03/22/23 0833  BP: (!) 146/75  Pulse: 80  Weight: 216 lb 8 oz (98.2 kg)  Height: 5' 9 (1.753 m)    Body mass index is 31.97 kg/m.   General: The patient is well-developed and well-nourished and in no acute distress   Neurologic Exam  Mental status: The patient is alert and oriented x 3 at the time of the examination. The patient has apparent normal recent and remote memory, with an apparently normal attention span and concentration ability.   Speech is normal.  Cranial nerves: Extraocular movements are full.  Color vision was normal and symmetric.  Facial strength and sensation was normal.  Trapezius strength is strong.. No obvious hearing deficits are noted.  Motor:  Muscle bulk is normal.   Muscle tone is normal. Strength is 5/5.   Sensory: Sensory testing showed normal sensation to temperature and vibration sensation in the arms and legs  Coordination: Finger-nose-finger is normal in both arms.  Heel-to-shin normal in both arms.  Gait and station: Station is normal.  Gait was normal but tandem gait is minimally wide.  The Romberg sign was negative. Reflexes: Deep tendon reflexes are symmetric and normal bilaterally.       ASSESSMENT AND PLAN  Multiple sclerosis (HCC) - Plan: Teriflunomide  14 MG TABS  High risk medication use  Gait disturbance  Vitamin D  deficiency   1.   Continue Aubagio  14 mg qd.  We will recheck lymphocytes anf LFT  Check MRI q 2-3 years.  We discussed if continues to do very well may be  able to stop DMT around age 32 2.  Continue vitamin D  supplements. 3.   Stay active and exercise.    She exercises daily. 4.   Since she has been very stable, we can change appointments to once a year instead of twice a year.  Return to clinic 12 months or sooner if there are new or worsening neurologic symptoms.  This visit is part of a comprehensive longitudinal care medical relationship regarding the patients primary diagnosis of MS and related concerns.   Chidi Shirer A. Vear, MD, PhD, DIEDRA    03/22/23   8:57 AM Certified in Neurology, Clinical Neurophysiology, Sleep Medicine, Pain Medicine and Neuroimaging Director, [facility]  [facility] [address] [place], [place] [address] [phone]

## 2023-03-23 LAB — COMPREHENSIVE METABOLIC PANEL
ALT: 16 [IU]/L (ref 0–32)
AST: 13 [IU]/L (ref 0–40)
Albumin: 4.4 g/dL (ref 3.8–4.9)
Alkaline Phosphatase: 118 [IU]/L (ref 44–121)
BUN/Creatinine Ratio: 10 (ref 9–23)
BUN: 7 mg/dL (ref 6–24)
Bilirubin Total: 0.3 mg/dL (ref 0.0–1.2)
CO2: 26 mmol/L (ref 20–29)
Calcium: 10.8 mg/dL — ABNORMAL HIGH (ref 8.7–10.2)
Chloride: 101 mmol/L (ref 96–106)
Creatinine, Ser: 0.7 mg/dL (ref 0.57–1.00)
Globulin, Total: 3.2 g/dL (ref 1.5–4.5)
Glucose: 77 mg/dL (ref 70–99)
Potassium: 4.3 mmol/L (ref 3.5–5.2)
Sodium: 140 mmol/L (ref 134–144)
Total Protein: 7.6 g/dL (ref 6.0–8.5)
eGFR: 102 mL/min/{1.73_m2} (ref >59)

## 2023-03-23 LAB — CBC WITH DIFFERENTIAL/PLATELET
Basophils Absolute: 0 10*3/uL (ref 0.0–0.2)
Basos: 0 %
EOS (ABSOLUTE): 0.1 10*3/uL (ref 0.0–0.4)
Eos: 2 %
Hematocrit: 42.2 % (ref 34.0–46.6)
Hemoglobin: 13.4 g/dL (ref 11.1–15.9)
Immature Grans (Abs): 0 10*3/uL (ref 0.0–0.1)
Immature Granulocytes: 0 %
Lymphocytes Absolute: 1.1 10*3/uL (ref 0.7–3.1)
Lymphs: 24 %
MCH: 23.5 pg — ABNORMAL LOW (ref 26.6–33.0)
MCHC: 31.8 g/dL (ref 31.5–35.7)
MCV: 74 fL — ABNORMAL LOW (ref 79–97)
Monocytes Absolute: 0.5 10*3/uL (ref 0.1–0.9)
Monocytes: 10 %
Neutrophils Absolute: 3 10*3/uL (ref 1.4–7.0)
Neutrophils: 64 %
Platelets: 322 10*3/uL (ref 150–450)
RBC: 5.7 x10E6/uL — ABNORMAL HIGH (ref 3.77–5.28)
RDW: 16.6 % — ABNORMAL HIGH (ref 11.7–15.4)
WBC: 4.7 10*3/uL (ref 3.4–10.8)

## 2023-06-04 DIAGNOSIS — J029 Acute pharyngitis, unspecified: Secondary | ICD-10-CM | POA: Diagnosis not present

## 2023-06-04 DIAGNOSIS — R509 Fever, unspecified: Secondary | ICD-10-CM | POA: Diagnosis not present

## 2023-06-04 DIAGNOSIS — R42 Dizziness and giddiness: Secondary | ICD-10-CM | POA: Diagnosis not present

## 2023-06-04 DIAGNOSIS — R519 Headache, unspecified: Secondary | ICD-10-CM | POA: Diagnosis not present

## 2023-06-15 DIAGNOSIS — G35 Multiple sclerosis: Secondary | ICD-10-CM | POA: Diagnosis not present

## 2023-06-15 DIAGNOSIS — I10 Essential (primary) hypertension: Secondary | ICD-10-CM | POA: Diagnosis not present

## 2023-06-15 DIAGNOSIS — E7800 Pure hypercholesterolemia, unspecified: Secondary | ICD-10-CM | POA: Diagnosis not present

## 2023-09-17 DIAGNOSIS — N3000 Acute cystitis without hematuria: Secondary | ICD-10-CM | POA: Diagnosis not present

## 2023-09-17 DIAGNOSIS — R300 Dysuria: Secondary | ICD-10-CM | POA: Diagnosis not present

## 2023-12-08 DIAGNOSIS — H40003 Preglaucoma, unspecified, bilateral: Secondary | ICD-10-CM | POA: Diagnosis not present

## 2023-12-08 DIAGNOSIS — H52223 Regular astigmatism, bilateral: Secondary | ICD-10-CM | POA: Diagnosis not present

## 2023-12-28 ENCOUNTER — Other Ambulatory Visit: Payer: Self-pay | Admitting: Neurology

## 2023-12-28 DIAGNOSIS — G35D Multiple sclerosis, unspecified: Secondary | ICD-10-CM

## 2023-12-28 NOTE — Telephone Encounter (Signed)
 Last filled by patient on 12/07/23 Last office visit : 03/22/23 Next office visit : 03/24/24

## 2024-01-11 DIAGNOSIS — Z1231 Encounter for screening mammogram for malignant neoplasm of breast: Secondary | ICD-10-CM | POA: Diagnosis not present

## 2024-01-16 DIAGNOSIS — Z0183 Encounter for blood typing: Secondary | ICD-10-CM | POA: Diagnosis not present

## 2024-01-16 DIAGNOSIS — E7800 Pure hypercholesterolemia, unspecified: Secondary | ICD-10-CM | POA: Diagnosis not present

## 2024-01-16 DIAGNOSIS — Z23 Encounter for immunization: Secondary | ICD-10-CM | POA: Diagnosis not present

## 2024-01-16 DIAGNOSIS — I10 Essential (primary) hypertension: Secondary | ICD-10-CM | POA: Diagnosis not present

## 2024-03-24 ENCOUNTER — Ambulatory Visit (INDEPENDENT_AMBULATORY_CARE_PROVIDER_SITE_OTHER): Payer: 59 | Admitting: Neurology

## 2024-03-24 ENCOUNTER — Encounter: Payer: Self-pay | Admitting: Neurology

## 2024-03-24 VITALS — BP 140/82 | HR 83 | Ht 70.0 in | Wt 218.0 lb

## 2024-03-24 DIAGNOSIS — R269 Unspecified abnormalities of gait and mobility: Secondary | ICD-10-CM | POA: Diagnosis not present

## 2024-03-24 DIAGNOSIS — G35A Relapsing-remitting multiple sclerosis: Secondary | ICD-10-CM | POA: Diagnosis not present

## 2024-03-24 DIAGNOSIS — Z79899 Other long term (current) drug therapy: Secondary | ICD-10-CM

## 2024-03-24 DIAGNOSIS — E559 Vitamin D deficiency, unspecified: Secondary | ICD-10-CM

## 2024-03-24 NOTE — Progress Notes (Signed)
 "  [facility]  PATIENT: Margaret Henderson DOB: May 29, 1965  REFERRING DOCTOR OR PCP:  Dr. Rankin Dike [facility] [place]), Neuro:  Rosella Mau SOURCE: paitent and records form [facility]  _________________________________   HISTORICAL  CHIEF COMPLAINT:  Chief Complaint  Patient presents with   Multiple Sclerosis    Rm10alone, Ms follow up    HISTORY OF PRESENT ILLNESS:  Margaret Henderson is a 59 y.o. woman with RRMS.     She was diagnsed in 2002 with a spinal cord syndrome.  Update 03/24/2024 She feels her MS is stable.   She is on teriflunomide  and tolerates it well.   She has no exacerbation or new neurologic function.  Her last exacerbation was > 15 years ago   She did Solu-medrol  3 times many years ago.      Lymphocytes have been low. (they were lower on Tecfidera ).  They were 1.1 12/23.  She has thalassemia and takes iron.   She has reduced MCV but good Hgb.     MRI 03/25/2022 showed no new lesions  Her gait is doing well.  Balance is good.  No falls.  She has no problem with stairs.   She exercises daily.  She denies numbness or tingling or dysesthesia.     Vision is fine.     Bladder function is doing well.   She wakes up once at night to urinate most nights, more if she drinks more at night.    She denies significant fatigue     She sleeps well.   Cognition is doing well.   Her mom passed 08/05/2022.   She had been the only living child.  Mood is doing ok.      MS history:     She was diagnosed with multiple sclerosis in 2002 after presenting with numbness below her waist and clumsiness in the arms and legs. At that time, she was found to have an MRI consistent with multiple sclerosis. She also underwent a lumbar puncture and CSF was  consistent with multiple sclerosis. She was started on Betaseron. Last year, an MRI of the brain showed 2 new foci not present in her earlier MRI. She had MRI's mid 2016 at [place]. . The brain and cervical spine were unchanged but her  thoracic spine showed a new focus compared to 2015.  She switched to Tecfidera  in 2016 to change to an oral agent but lymphocytes were low.  She changed to Aubagio  and for a while was on every other day due to low lymphocytes.  Other:   She has thalassemia and needs to take iron.     Imaging: MRI brain 01/22/2020 showed T2/flair hyperintense foci predominantly in the periventricular white matter consistent with chronic demyelinating plaque associated with multiple sclerosis.  None of the foci appear to be acute.  They do not enhance.  Compared to the MRI dated 05/18/2017, there are no new lesions..   There is a normal enhancement pattern and no acute findings.  MRI brain 03/25/2022 showed no new lesions.     REVIEW OF SYSTEMS: Constitutional: No fevers, chills, sweats, or change in appetite.   Rare fatigue Eyes: No visual changes, double vision, eye pain Ear, nose and throat: No hearing loss, ear pain, nasal congestion, sore throat Cardiovascular: No chest pain, palpitations Respiratory:  No shortness of breath at rest or with exertion.   No wheezes GastrointestinaI: No nausea, vomiting, diarrhea, abdominal pain, fecal incontinence Genitourinary:  No dysuria, urinary retention or frequency.  No nocturia. Musculoskeletal:  No neck pain, back pain Integumentary: No rash, pruritus, skin lesions Neurological: as above Psychiatric: No depression at this time.  No anxiety Endocrine: No palpitations, diaphoresis, change in appetite, change in weigh or increased thirst Hematologic/Lymphatic:  No anemia, purpura, petechiae. Allergic/Immunologic: No itchy/runny eyes, nasal congestion, recent allergic reactions, rashes  ALLERGIES: Allergies  Allergen Reactions   Prednisone Other (See Comments)    Headache    HOME MEDICATIONS:  Current Outpatient Medications:    amLODipine (NORVASC) 5 MG tablet, Take 10 mg by mouth daily. 10mg , Disp: , Rfl: 4   Biotin 1000 MCG tablet, Take 1,000 mcg by mouth 3  (three) times daily., Disp: , Rfl:    cholecalciferol (VITAMIN D ) 1000 UNITS tablet, Take 2,000 Units by mouth 2 (two) times daily., Disp: , Rfl:    ferrous sulfate 325 (65 FE) MG tablet, Take 325 mg by mouth daily with breakfast., Disp: , Rfl:    losartan (COZAAR) 25 MG tablet, Take 25 mg by mouth daily., Disp: , Rfl:    Teriflunomide  14 MG TABS, TAKE 1 TABLET BY MOUTH DAILY, Disp: 30 tablet, Rfl: 5   vitamin B-12 (CYANOCOBALAMIN) 500 MCG tablet, Take 500 mcg by mouth daily., Disp: , Rfl:   PAST MEDICAL HISTORY: Past Medical History:  Diagnosis Date   Hypertension    Multiple sclerosis    Vision abnormalities     PAST SURGICAL HISTORY: Past Surgical History:  Procedure Laterality Date   TUBAL LIGATION  1994    FAMILY HISTORY: Family History  Problem Relation Age of Onset   Anemia Mother    Diabetes Mother    Hypertension Mother    Multiple sclerosis Neg Hx     SOCIAL HISTORY:  Social History   Socioeconomic History   Marital status: Married    Spouse name: Not on file   Number of children: 2   Years of education: Not on file   Highest education level: Not on file  Occupational History   Occupation: Medical records  Tobacco Use   Smoking status: Never   Smokeless tobacco: Never  Substance and Sexual Activity   Alcohol use: No    Alcohol/week: 0.0 standard drinks of alcohol   Drug use: No   Sexual activity: Not on file  Other Topics Concern   Not on file  Social History Narrative   Right handed    Caffeine use: Tea sometimes.   Social Drivers of Health   Tobacco Use: Low Risk (03/24/2024)   Patient History    Smoking Tobacco Use: Never    Smokeless Tobacco Use: Never    Passive Exposure: Not on file  Financial Resource Strain: Not on file  Food Insecurity: No Food Insecurity (04/04/2021)   Hunger Vital Sign    Worried About Running Out of Food in the Last Year: Never true    Ran Out of Food in the Last Year: Never true  Transportation Needs: Not on file   Physical Activity: Not on file  Stress: Not on file  Social Connections: Not on file  Intimate Partner Violence: Not on file  Depression (PHQ2-9): Low Risk (04/04/2021)   Depression (PHQ2-9)    PHQ-2 Score: 0  Alcohol Screen: Not on file  Housing: Not on file  Utilities: Not on file  Health Literacy: Not on file     PHYSICAL EXAM  Vitals:   03/24/24 0821 03/24/24 0826  BP: (!) 154/79 (!) 140/82  Pulse: 83   Weight: 218 lb (98.9 kg)  Height: 5' 10 (1.778 m)     Body mass index is 31.28 kg/m.   General: The patient is well-developed and well-nourished and in no acute distress   Neurologic Exam  Mental status: The patient is alert and oriented x 3 at the time of the examination. The patient has apparent normal recent and remote memory, with an apparently normal attention span and concentration ability.   Speech is normal.  Cranial nerves: Extraocular movements are full.  Color vision was normal and symmetric.  Facial strength and sensation was normal.  Trapezius strength is strong.. No obvious hearing deficits are noted.  Motor:  Muscle bulk is normal.   Muscle tone is normal. Strength is 5/5.  Squat and rise is fine  Sensory: Sensory testing showed normal sensation to temperature and vibration sensation in the arms and legs  Coordination: Finger-nose-finger is normal in both arms.  She has normal heel-to-shin in the legs.  Gait and station: Station is normal.  Gait was normal but tandem gait is mildly wide.  The Romberg sign was negative.  Reflexes: Deep tendon reflexes are symmetric and normal bilaterally.       ASSESSMENT AND PLAN  Multiple sclerosis, relapsing-remitting  High risk medication use  Gait disturbance  Vitamin D  deficiency   1.   She will continue teriflunomide  14 mg daily.  We will check CBC with differential, liver function tests and vitamin D .  Additionally I will check an MRI of the brain to determine if there is any breakthrough  activity and consider a different disease modifying therapy if this is occurring if she continues to be stable a few more years, we could consider discontinuing a DMT in a few years   2.  Continue vitamin D  supplements. 3.   Stay active and exercise.    She exercises daily. 4.   Margaret Henderson has been very stable .  Return to clinic 12 months or sooner if there are new or worsening neurologic symptoms.  This visit is part of a comprehensive longitudinal care medical relationship regarding the patients primary diagnosis of MS and related concerns.   Kiko Ripp A. Vear, MD, PhD, DIEDRA    03/24/2024   8:33 AM Certified in Neurology, Clinical Neurophysiology, Sleep Medicine, Pain Medicine and Neuroimaging Director, [facility]  [facility] [address] [place], [place] [address] [phone]    "

## 2024-03-25 ENCOUNTER — Telehealth: Payer: Self-pay | Admitting: Neurology

## 2024-03-25 ENCOUNTER — Ambulatory Visit: Payer: Self-pay | Admitting: Neurology

## 2024-03-25 LAB — CBC WITH DIFFERENTIAL/PLATELET
Basophils Absolute: 0 x10E3/uL (ref 0.0–0.2)
Basos: 1 %
EOS (ABSOLUTE): 0.2 x10E3/uL (ref 0.0–0.4)
Eos: 4 %
Hematocrit: 41.7 % (ref 34.0–46.6)
Hemoglobin: 13.2 g/dL (ref 11.1–15.9)
Immature Grans (Abs): 0 x10E3/uL (ref 0.0–0.1)
Immature Granulocytes: 0 %
Lymphocytes Absolute: 1.3 x10E3/uL (ref 0.7–3.1)
Lymphs: 28 %
MCH: 23.9 pg — ABNORMAL LOW (ref 26.6–33.0)
MCHC: 31.7 g/dL (ref 31.5–35.7)
MCV: 76 fL — ABNORMAL LOW (ref 79–97)
Monocytes Absolute: 0.5 x10E3/uL (ref 0.1–0.9)
Monocytes: 10 %
Neutrophils Absolute: 2.8 x10E3/uL (ref 1.4–7.0)
Neutrophils: 57 %
Platelets: 312 x10E3/uL (ref 150–450)
RBC: 5.52 x10E6/uL — ABNORMAL HIGH (ref 3.77–5.28)
RDW: 16.1 % — ABNORMAL HIGH (ref 11.7–15.4)
WBC: 4.7 x10E3/uL (ref 3.4–10.8)

## 2024-03-25 LAB — HEPATIC FUNCTION PANEL
ALT: 18 IU/L (ref 0–32)
AST: 22 IU/L (ref 0–40)
Albumin: 4.3 g/dL (ref 3.8–4.9)
Alkaline Phosphatase: 144 IU/L — ABNORMAL HIGH (ref 49–135)
Bilirubin Total: 0.3 mg/dL (ref 0.0–1.2)
Bilirubin, Direct: <0.08 mg/dL (ref 0.00–0.40)
Total Protein: 7.3 g/dL (ref 6.0–8.5)

## 2024-03-25 LAB — VITAMIN D 25 HYDROXY (VIT D DEFICIENCY, FRACTURES): Vit D, 25-Hydroxy: 48.2 ng/mL (ref 30.0–100.0)

## 2024-03-25 NOTE — Telephone Encounter (Signed)
 no auth required sent to [facility] [phone]

## 2024-04-22 ENCOUNTER — Ambulatory Visit
Admission: RE | Admit: 2024-04-22 | Discharge: 2024-04-22 | Disposition: A | Source: Ambulatory Visit | Attending: Neurology | Admitting: Neurology

## 2024-04-22 DIAGNOSIS — G35A Relapsing-remitting multiple sclerosis: Secondary | ICD-10-CM

## 2024-04-22 DIAGNOSIS — R269 Unspecified abnormalities of gait and mobility: Secondary | ICD-10-CM

## 2024-04-22 MED ORDER — GADOPICLENOL 0.5 MMOL/ML IV SOLN
10.0000 mL | Freq: Once | INTRAVENOUS | Status: AC | PRN
  Administered 2024-04-22: 10 mL via INTRAVENOUS

## 2025-03-26 ENCOUNTER — Ambulatory Visit: Admitting: Neurology
